# Patient Record
Sex: Male | Born: 1965 | Race: White | Hispanic: No | Marital: Married | Smoking: Never smoker
Health system: Southern US, Community
[De-identification: ages and names within clinical notes are randomized; demographics above are authoritative.]

## PROBLEM LIST (undated history)

## (undated) DIAGNOSIS — R7303 Prediabetes: Secondary | ICD-10-CM

## (undated) DIAGNOSIS — M109 Gout, unspecified: Secondary | ICD-10-CM

## (undated) HISTORY — DX: Prediabetes: R73.03

## (undated) HISTORY — DX: Gout, unspecified: M10.9

---

## 2010-07-08 ENCOUNTER — Inpatient Hospital Stay (HOSPITAL_COMMUNITY): Admission: EM | Admit: 2010-07-08 | Discharge: 2010-07-10 | Payer: Self-pay | Admitting: Emergency Medicine

## 2010-07-09 ENCOUNTER — Encounter (INDEPENDENT_AMBULATORY_CARE_PROVIDER_SITE_OTHER): Payer: Self-pay | Admitting: General Surgery

## 2011-02-02 LAB — COMPREHENSIVE METABOLIC PANEL
AST: 25 U/L (ref 0–37)
GFR calc non Af Amer: >60 mL/min (ref >60)
Glucose, Bld: 122 mg/dL — ABNORMAL HIGH (ref 70–99)
Potassium: 4.2 mEq/L (ref 3.5–5.1)
Sodium: 140 mEq/L (ref 135–145)
Total Bilirubin: 1 mg/dL (ref 0.3–1.2)
Total Protein: 7.3 g/dL (ref 6.0–8.3)

## 2011-02-02 LAB — DIFFERENTIAL
Basophils Absolute: 0 10*3/uL (ref 0.0–0.1)
Eosinophils Absolute: 0 10*3/uL (ref 0.0–0.7)
Eosinophils Relative: 0 % (ref 0–5)
Lymphocytes Relative: 11 % — ABNORMAL LOW (ref 12–46)

## 2011-02-02 LAB — URINALYSIS, ROUTINE W REFLEX MICROSCOPIC
Bilirubin Urine: NEGATIVE
Ketones, ur: NEGATIVE mg/dL
Nitrite: NEGATIVE
Specific Gravity, Urine: 1.027 (ref 1.005–1.030)
pH: 6.5 (ref 5.0–8.0)

## 2011-02-02 LAB — CBC
HCT: 42.9 % (ref 39.0–52.0)
Hemoglobin: 13.7 g/dL (ref 13.0–17.0)
MCHC: 31.9 g/dL (ref 30.0–36.0)
MCV: 93.3 fL (ref 78.0–100.0)
Platelets: 280 10*3/uL (ref 150–400)
RBC: 4.6 MIL/uL (ref 4.22–5.81)
RDW: 13.4 % (ref 11.5–15.5)

## 2012-03-31 ENCOUNTER — Ambulatory Visit
Admission: RE | Admit: 2012-03-31 | Discharge: 2012-03-31 | Disposition: A | Discharge status: Home / Self Care | Payer: No Typology Code available for payment source | Source: Ambulatory Visit | Attending: Physician Assistant | Admitting: Physician Assistant

## 2012-03-31 ENCOUNTER — Other Ambulatory Visit: Payer: Self-pay | Admitting: Physician Assistant

## 2012-03-31 DIAGNOSIS — M25562 Pain in left knee: Secondary | ICD-10-CM

## 2013-11-30 ENCOUNTER — Ambulatory Visit (INDEPENDENT_AMBULATORY_CARE_PROVIDER_SITE_OTHER): Payer: BC Managed Care – PPO | Admitting: Family Medicine

## 2013-11-30 ENCOUNTER — Encounter: Payer: Self-pay | Admitting: Family Medicine

## 2013-11-30 VITALS — BP 114/66 | HR 72 | Temp 97.7°F | Resp 18 | Ht 73.0 in | Wt 340.0 lb

## 2013-11-30 DIAGNOSIS — M109 Gout, unspecified: Secondary | ICD-10-CM | POA: Insufficient documentation

## 2013-11-30 MED ORDER — PREDNISONE 20 MG PO TABS: ORAL_TABLET | ORAL | Status: DC

## 2013-11-30 NOTE — Progress Notes (Signed)
   Subjective:    Patient ID: Walter [name], male    DOB: 06-24-1966, 48 y.o.   MRN: [id number]  HPI Patient has a history of gout. He reports 5-6 attacks per year. He was previously seen here 2 years ago for a similar attack in his left knee. He reports one and swelling in the left knee. This is been going for possibly 5 days. He denies any injury to the knee. He denies any joint laxity. He is to have an ultrasound of the left knee which revealed a Baker's cyst. During that gout exacerbation, history design, and experienced almost instantaneous relief of her. 24 hours. He is requesting similar treatment at this time. He is on no preventative medication for gout. He has not had a uric acid level checked nor any lab work. This is due to the fact he did not have insurance previously and declined laboratory testing due to cost. Past Medical History  Diagnosis Date  . Gout    No current outpatient prescriptions on file prior to visit.   No current facility-administered medications on file prior to visit.   No Known Allergies History   Social History  . Marital Status: Married    Spouse Name: N/A    Number of Children: N/A  . Years of Education: N/A   Occupational History  . Not on file.   Social History Main Topics  . Smoking status: Never Smoker   . Smokeless tobacco: Never Used  . Alcohol Use: No  . Drug Use: No  . Sexual Activity: Not on file   Other Topics Concern  . Not on file   Social History Narrative  . No narrative on file      Review of Systems  All other systems reviewed and are negative.       Objective:   Physical Exam  Vitals reviewed. Cardiovascular: Normal rate and regular rhythm.   Pulmonary/Chest: Effort normal and breath sounds normal.  Musculoskeletal:       Left knee: He exhibits decreased range of motion, swelling, effusion, bony tenderness and abnormal meniscus. He exhibits no erythema, no LCL laxity and normal patellar mobility. Tenderness  found. Medial joint line and lateral joint line tenderness noted. No MCL and no LCL tenderness noted.   patient is in such pain it is difficult to truly examine the left knee because everything hurts.        Assessment & Plan:  1. Gout flare I suspect this is a gout exacerbation. Treated with a prednisone taper pack. I also recommended that the patient return for a uric acid level as well some fasting lab work. If his uric acid level is elevated I recommend beginning allopurinol on a daily basis for preventative after he's been asymptomatic for 2-3 weeks.  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 - predniSONE (DELTASONE) 20 MG tablet; 3 tabs poqday 1-2, 2 tabs poqday 3-4, 1 tab poqday 5-6  Dispense: 12 tablet; Refill: 0

## 2014-03-22 ENCOUNTER — Ambulatory Visit (INDEPENDENT_AMBULATORY_CARE_PROVIDER_SITE_OTHER): Payer: BC Managed Care – PPO | Admitting: Family Medicine

## 2014-03-22 ENCOUNTER — Encounter: Payer: Self-pay | Admitting: Family Medicine

## 2014-03-22 VITALS — BP 118/74 | HR 80 | Temp 98.5°F | Resp 18 | Ht 75.0 in | Wt 335.0 lb

## 2014-03-22 DIAGNOSIS — IMO0002 Reserved for concepts with insufficient information to code with codable children: Secondary | ICD-10-CM

## 2014-03-22 DIAGNOSIS — S83206A Unspecified tear of unspecified meniscus, current injury, right knee, initial encounter: Secondary | ICD-10-CM

## 2014-03-22 MED ORDER — PREDNISONE 20 MG PO TABS: ORAL_TABLET | ORAL | Status: DC

## 2014-03-22 NOTE — Progress Notes (Signed)
   Subjective:    Patient ID: Walter [name], male    DOB: Jan 24, 1966, 48 y.o.   MRN: [id number]  HPI Patient reports several weeks of worsening bilateral knee pain right greater than left.  Both knees ache in the jointlines and under the patella, but the right knee has acute pain in the posterolateral aspect near the meniscus.  He has a significantly positive Apley grind test.  He has tried 1 month of Naproxyn with minimal benefit. Past Medical History  Diagnosis Date  . Gout    No current outpatient prescriptions on file prior to visit.   No current facility-administered medications on file prior to visit.   No Known Allergies History   Social History  . Marital Status: Married    Spouse Name: N/A    Number of Children: N/A  . Years of Education: N/A   Occupational History  . Not on file.   Social History Main Topics  . Smoking status: Never Smoker   . Smokeless tobacco: Never Used  . Alcohol Use: No  . Drug Use: No  . Sexual Activity: Not on file   Other Topics Concern  . Not on file   Social History Narrative  . No narrative on file      Review of Systems  All other systems reviewed and are negative.      Objective:   Physical Exam  Vitals reviewed. Cardiovascular: Normal rate and regular rhythm.   Pulmonary/Chest: Effort normal and breath sounds normal.  Musculoskeletal:       Right knee: He exhibits decreased range of motion, abnormal patellar mobility and abnormal meniscus. He exhibits no swelling, no effusion, no deformity, no laceration, no erythema, normal alignment and no LCL laxity. Tenderness found. Medial joint line and lateral joint line tenderness noted. No MCL and no LCL tenderness noted.          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  Suggested cortisone injection but he elects for 72 hours  of rest, ice, and a prednisone dose pack.  Follow up if no better.

## 2014-03-26 ENCOUNTER — Encounter: Payer: Self-pay | Admitting: Family Medicine

## 2014-03-26 ENCOUNTER — Ambulatory Visit (INDEPENDENT_AMBULATORY_CARE_PROVIDER_SITE_OTHER): Payer: BC Managed Care – PPO | Admitting: Family Medicine

## 2014-03-26 ENCOUNTER — Other Ambulatory Visit: Payer: Self-pay | Admitting: Family Medicine

## 2014-03-26 VITALS — BP 120/82 | HR 76 | Temp 98.0°F | Resp 18 | Wt 327.0 lb

## 2014-03-26 DIAGNOSIS — M109 Gout, unspecified: Secondary | ICD-10-CM

## 2014-03-26 LAB — COMPLETE METABOLIC PANEL WITH GFR
ALT: 19 U/L (ref 0–53)
AST: 12 U/L (ref 0–37)
Albumin: 4.1 g/dL (ref 3.5–5.2)
Alkaline Phosphatase: 75 U/L (ref 39–117)
BILIRUBIN TOTAL: 0.4 mg/dL (ref 0.2–1.2)
BUN: 14 mg/dL (ref 6–23)
CALCIUM: 9.6 mg/dL (ref 8.4–10.5)
CHLORIDE: 100 meq/L (ref 96–112)
CO2: 26 mEq/L (ref 19–32)
CREATININE: 1.06 mg/dL (ref 0.50–1.35)
GFR, EST NON AFRICAN AMERICAN: 82 mL/min
GFR, Est African American: >89 mL/min
GLUCOSE: 116 mg/dL — AB (ref 70–99)
Potassium: 4.8 mEq/L (ref 3.5–5.3)
Sodium: 138 mEq/L (ref 135–145)
TOTAL PROTEIN: 6.9 g/dL (ref 6.0–8.3)

## 2014-03-26 LAB — URIC ACID: Uric Acid, Serum: 9.3 mg/dL — ABNORMAL HIGH (ref 4.0–7.8)

## 2014-03-26 NOTE — Progress Notes (Signed)
   Subjective:    Patient ID: Walter [name], male    DOB: 11-Feb-1966, 48 y.o.   MRN: [id number]  HPI 03/22/14 Patient reports several weeks of worsening bilateral knee pain right greater than left.  Both knees ache in the jointlines and under the patella, but the right knee has acute pain in the posterolateral aspect near the meniscus.  He has a significantly positive Apley grind test.  He has tried 1 month of Naproxyn with minimal benefit.  At that time, my plan was: I suspect bilateral osteoarthritis made acutely worse by a right lateral meniscal tear.  Suggested cortisone injection but he elects for 72 hours of rest, ice, and a prednisone dose pack.  Follow up if no better. 03/26/14 Patient is here today for recheck. He states his knee pain is proximally 95% better. He is still taking the prednisone dose pack. However he is satisfied with his pain relief at this time. He is interested today in starting medication to help prevent gout attacks. He has frequent gout attacks throughout the year. He also has almost daily pain in his knee in between the attacks. No one has checked a uric acid level. Past Medical History  Diagnosis Date  . Gout    Current Outpatient Prescriptions on File Prior to Visit  Medication Sig Dispense Refill  . predniSONE (DELTASONE) 20 MG tablet 3 tabs poqday 1-2, 2 tabs poqday 3-4, 1 tab poqday 5-6  12 tablet  0   No current facility-administered medications on file prior to visit.   No Known Allergies History   Social History  . Marital Status: Married    Spouse Name: N/A    Number of Children: N/A  . Years of Education: N/A   Occupational History  . Not on file.   Social History Main Topics  . Smoking status: Never Smoker   . Smokeless tobacco: Never Used  . Alcohol Use: No  . Drug Use: No  . Sexual Activity: Not on file   Other Topics Concern  . Not on file   Social History Narrative  . No narrative on file      Review of Systems  All other  systems reviewed and are negative.      Objective:   Physical Exam  Vitals reviewed. Cardiovascular: Normal rate and regular rhythm.   Pulmonary/Chest: Effort normal and breath sounds normal.  Musculoskeletal:       Right knee: He exhibits normal range of motion, no swelling, no effusion, no deformity, no laceration, no erythema, normal alignment, no LCL laxity and normal meniscus. Tenderness found. No medial joint line, no lateral joint line, no MCL and no LCL tenderness noted.          Assessment & Plan:  Gout - Plan: COMPLETE METABOLIC PANEL WITH GFR, Uric acid  1. Gout Check uric acid level, and greater than 6 begin the patient on haloperidol. I would simultaneously use indomethacin 25 mg by mouth daily to prevent gout attack while the patient is starting allopurinol. After 6 weeks I would recheck a uric acid level. I also recommended a complete physical exam, prostate exam, and a colonoscopy as the patient is starting to turn 48. He will consider. - COMPLETE METABOLIC PANEL WITH GFR - Uric acid

## 2014-03-30 LAB — HEMOGLOBIN A1C
Hgb A1c MFr Bld: 6.2 % — ABNORMAL HIGH (ref <5.7)
Mean Plasma Glucose: 131 mg/dL — ABNORMAL HIGH (ref <117)

## 2014-03-31 ENCOUNTER — Telehealth: Payer: Self-pay | Admitting: Family Medicine

## 2014-03-31 DIAGNOSIS — M109 Gout, unspecified: Secondary | ICD-10-CM

## 2014-03-31 DIAGNOSIS — E790 Hyperuricemia without signs of inflammatory arthritis and tophaceous disease: Secondary | ICD-10-CM

## 2014-03-31 MED ORDER — ALLOPURINOL 100 MG PO TABS: 200.0000 mg | ORAL_TABLET | Freq: Every day | ORAL | Status: DC

## 2014-03-31 MED ORDER — INDOMETHACIN 25 MG PO CAPS: 25.0000 mg | ORAL_CAPSULE | Freq: Every day | ORAL | Status: DC

## 2014-03-31 NOTE — Telephone Encounter (Signed)
Message copied by Donne Anon on Wed Mar 31, 2014  9:49 AM ------      Message from: Lynnea Ferrier      Created: Mon Mar 29, 2014  7:17 AM       Add hga1c, uric acid is elevated, start allopurinol 200 mg poqday along with indomethicin 25 mg poqday and recheck uric acid level in 6 weeks. ------

## 2014-03-31 NOTE — Telephone Encounter (Signed)
Pt aware of all lab results.  Discussed low carb diet (to send pt counting carb sheet)  And try to lose some weight.  Also aware of high uric acid.  Two new Rx's to pharmacy.  Pt aware need to repeat lab in 6 weeks.  Order placed

## 2014-03-31 NOTE — Telephone Encounter (Signed)
Message copied by Donne Anon on Wed Mar 31, 2014  9:50 AM ------      Message from: Lynnea Ferrier      Created: Tue Mar 30, 2014  4:18 PM       Patient also has prediabetes and needs to decrease carbs (bread, rice, pasta, potatoes) and exercise to lose 10-20 lbs to prevent transition into full blown diabetes. ------

## 2014-04-01 ENCOUNTER — Encounter: Payer: Self-pay | Admitting: Family Medicine

## 2014-07-09 ENCOUNTER — Other Ambulatory Visit: Payer: Self-pay | Admitting: Family Medicine

## 2014-07-09 NOTE — Telephone Encounter (Signed)
Refill appropriate and filled per protocol. 

## 2015-09-28 ENCOUNTER — Ambulatory Visit (INDEPENDENT_AMBULATORY_CARE_PROVIDER_SITE_OTHER): Payer: BLUE CROSS/BLUE SHIELD

## 2015-09-28 ENCOUNTER — Encounter: Payer: Self-pay | Admitting: Podiatry

## 2015-09-28 ENCOUNTER — Ambulatory Visit (INDEPENDENT_AMBULATORY_CARE_PROVIDER_SITE_OTHER): Payer: BLUE CROSS/BLUE SHIELD | Admitting: Podiatry

## 2015-09-28 VITALS — BP 134/69 | HR 77 | Resp 16

## 2015-09-28 DIAGNOSIS — M1000 Idiopathic gout, unspecified site: Secondary | ICD-10-CM

## 2015-09-28 DIAGNOSIS — M722 Plantar fascial fibromatosis: Secondary | ICD-10-CM

## 2015-09-28 DIAGNOSIS — M779 Enthesopathy, unspecified: Secondary | ICD-10-CM | POA: Diagnosis not present

## 2015-09-28 MED ORDER — TRIAMCINOLONE ACETONIDE 10 MG/ML IJ SUSP
10.0000 mg | Freq: Once | INTRAMUSCULAR | Status: AC
  Administered 2015-09-28: 10 mg

## 2015-09-28 MED ORDER — METHYLPREDNISOLONE 4 MG PO TBPK: ORAL_TABLET | ORAL | Status: DC

## 2015-09-28 NOTE — Progress Notes (Signed)
   Subjective:    Patient ID: Walter [name], male    DOB: 04-13-1966, 49 y.o.   MRN: [id number]  HPI  Pt presents with left foot pain at mpj with redness and swelling lasting 1 month, has a h/o of gout  Review of Systems  All other systems reviewed and are negative.      Objective:   Physical Exam        Assessment & Plan:

## 2015-09-28 NOTE — Progress Notes (Signed)
Subjective:     Patient ID: Walter [name], male   DOB: 12/08/1965, 49 y.o.   MRN: [id number]  HPI patient presents stating the left big toe joint is really sore and swollen and it's been this way for about a month and I have had history and gout but mostly in my knees   Review of Systems  All other systems reviewed and are negative.      Objective:   Physical Exam  Constitutional: He is oriented to person, place, and time.  Cardiovascular: Intact distal pulses.   Musculoskeletal: Normal range of motion.  Neurological: He is oriented to person, place, and time.  Skin: Skin is warm.  Nursing note and vitals reviewed.  neurovascular status found to be intact muscle strength adequate range of motion within normal limits with patient found to have inflamed red first MPJ left that's painful when pressed and is localized in nature. Patient has good digital perfusion and is noted to be well oriented 3 with moderate depression of the arch     Assessment:      inflammatory capsulitis left first MPJ with possibility for gout or other unknown systemic or localized condition    Plan:      H&P condition reviewed and x-rays reviewed indicating cyst in the first metatarsal head. Today were to treat conservatively see response and decide if any more appropriate testing is necessary and I did inject the capsule 3 mg Kenalog 5 mg Xylocaine placed on Dosepak and sent for blood work. Reappoint to reevaluate again in 1 week

## 2015-10-05 ENCOUNTER — Ambulatory Visit: Payer: BLUE CROSS/BLUE SHIELD | Admitting: Podiatry

## 2015-10-07 ENCOUNTER — Ambulatory Visit: Payer: BLUE CROSS/BLUE SHIELD | Admitting: Podiatry

## 2017-01-01 ENCOUNTER — Encounter: Payer: Self-pay | Admitting: Family Medicine

## 2017-01-01 ENCOUNTER — Ambulatory Visit (INDEPENDENT_AMBULATORY_CARE_PROVIDER_SITE_OTHER): Payer: PRIVATE HEALTH INSURANCE | Admitting: Family Medicine

## 2017-01-01 VITALS — BP 130/90 | HR 86 | Temp 100.0°F | Resp 22 | Ht 75.0 in | Wt 344.0 lb

## 2017-01-01 DIAGNOSIS — R509 Fever, unspecified: Secondary | ICD-10-CM | POA: Diagnosis not present

## 2017-01-01 DIAGNOSIS — H6592 Unspecified nonsuppurative otitis media, left ear: Secondary | ICD-10-CM

## 2017-01-01 DIAGNOSIS — J111 Influenza due to unidentified influenza virus with other respiratory manifestations: Secondary | ICD-10-CM | POA: Diagnosis not present

## 2017-01-01 LAB — INFLUENZA A AND B AG, IMMUNOASSAY
INFLUENZA A ANTIGEN: NOT DETECTED
INFLUENZA B ANTIGEN: NOT DETECTED

## 2017-01-01 MED ORDER — AMOXICILLIN 875 MG PO TABS: 875.0000 mg | ORAL_TABLET | Freq: Two times a day (BID) | ORAL | 0 refills | Status: DC

## 2017-01-01 MED ORDER — OSELTAMIVIR PHOSPHATE 75 MG PO CAPS: 75.0000 mg | ORAL_CAPSULE | Freq: Two times a day (BID) | ORAL | 0 refills | Status: DC

## 2017-01-01 NOTE — Progress Notes (Signed)
   Subjective:    Patient ID: Walter [name], male    DOB: November 28, 1965, 51 y.o.   MRN: [id number]  HPI Symptoms began less than 48 hours ago. Symptoms consist of a high fever of 100-102 areas and he reports diffuse body aches, a nonproductive cough, severe sore throat, and left ear pain. On examination today, the patient is diaphoretic. He is nontoxic but he does appear sick. Left tympanic membrane is bulging and erythematous with a middle ear effusion. Otherwise remainder of his exam is unremarkable. However symptoms are consistent with influenza Past Medical History:  Diagnosis Date  . Gout    No past surgical history on file. No current outpatient prescriptions on file prior to visit.   No current facility-administered medications on file prior to visit.    No Known Allergies Social History   Social History  . Marital status: Married    Spouse name: N/A  . Number of children: N/A  . Years of education: N/A   Occupational History  . Not on file.   Social History Main Topics  . Smoking status: Never Smoker  . Smokeless tobacco: Never Used  . Alcohol use No  . Drug use: No  . Sexual activity: Not on file   Other Topics Concern  . Not on file   Social History Narrative  . No narrative on file      Review of Systems  All other systems reviewed and are negative.      Objective:   Physical Exam  HENT:  Right Ear: External ear normal.  Left Ear: External ear normal. Tympanic membrane is injected, erythematous and bulging.  Nose: Mucosal edema and rhinorrhea present.  Mouth/Throat: Oropharynx is clear and moist. No oropharyngeal exudate.  Neck: Neck supple.  Cardiovascular: Normal rate, regular rhythm and normal heart sounds.   Pulmonary/Chest: Effort normal and breath sounds normal. No respiratory distress. He has no wheezes. He has no rales.  Abdominal: Soft. Bowel sounds are normal.  Lymphadenopathy:    He has no cervical adenopathy.  Vitals  reviewed.         Assessment & Plan:  Influenza, left otitis media  Clinically the patient has the flu. Begin Tamiflu 75 mg by mouth twice a day for 5 days. Return to work when he is been afebrile for 48 hours. Push fluids and rest. I am very impressed by the severity of his otitis media on the left side. The patient also has a virus I'm concerned this could be a secondary bacterial infection. I will treat his otitis media with amoxicillin 875 mg by mouth twice a day for 10 days.

## 2017-01-04 ENCOUNTER — Ambulatory Visit (INDEPENDENT_AMBULATORY_CARE_PROVIDER_SITE_OTHER): Payer: PRIVATE HEALTH INSURANCE | Admitting: Family Medicine

## 2017-01-04 ENCOUNTER — Encounter: Payer: Self-pay | Admitting: Family Medicine

## 2017-01-04 VITALS — BP 130/90 | HR 100 | Temp 100.9°F | Resp 20 | Ht 75.0 in | Wt 335.0 lb

## 2017-01-04 DIAGNOSIS — J111 Influenza due to unidentified influenza virus with other respiratory manifestations: Secondary | ICD-10-CM

## 2017-01-04 MED ORDER — HYDROCODONE-HOMATROPINE 5-1.5 MG PO TABS: 1.0000 | ORAL_TABLET | Freq: Four times a day (QID) | ORAL | 0 refills | Status: DC | PRN

## 2017-01-04 NOTE — Progress Notes (Signed)
Subjective:    Patient ID: Walter [name], male    DOB: Apr 01, 1966, 51 y.o.   MRN: [id number]  HPI  01/01/17 Symptoms began less than 48 hours ago. Symptoms consist of a high fever of 100-102 areas and he reports diffuse body aches, a nonproductive cough, severe sore throat, and left ear pain. On examination today, the patient is diaphoretic. He is nontoxic but he does appear sick. Left tympanic membrane is bulging and erythematous with a middle ear effusion. Otherwise remainder of his exam is unremarkable. However symptoms are consistent with influenza.  At that time, my plan was: Clinically the patient has the flu. Begin Tamiflu 75 mg by mouth twice a day for 5 days. Return to work when he is been afebrile for 48 hours. Push fluids and rest. I am very impressed by the severity of his otitis media on the left side. The patient also has a virus I'm concerned this could be a secondary bacterial infection. I will treat his otitis media with amoxicillin 875 mg by mouth twice a day for 10 days.  01/04/17 Patient continues to run a fever. He continues to report diffuse body aches.  He reports headaches. His biggest complaint is a cough that will not stop the keeps him from sleeping. He also reports bilateral pleurisy due to frequency of his coughing. Unfortunately today his pulmonary exam is completely normal and his lungs are clear to auscultation bilaterally with no wheezes crackles or rales or evidence of pneumonia Past Medical History:  Diagnosis Date  . Gout    No past surgical history on file. Current Outpatient Prescriptions on File Prior to Visit  Medication Sig Dispense Refill  . amoxicillin (AMOXIL) 875 MG tablet Take 1 tablet (875 mg total) by mouth 2 (two) times daily. 20 tablet 0  . oseltamivir (TAMIFLU) 75 MG capsule Take 1 capsule (75 mg total) by mouth 2 (two) times daily. 10 capsule 0   No current facility-administered medications on file prior to visit.    No Known  Allergies Social History   Social History  . Marital status: Married    Spouse name: N/A  . Number of children: N/A  . Years of education: N/A   Occupational History  . Not on file.   Social History Main Topics  . Smoking status: Never Smoker  . Smokeless tobacco: Never Used  . Alcohol use No  . Drug use: No  . Sexual activity: Not on file   Other Topics Concern  . Not on file   Social History Narrative  . No narrative on file      Review of Systems  All other systems reviewed and are negative.      Objective:   Physical Exam  HENT:  Right Ear: External ear normal.  Left Ear: External ear normal. Tympanic membrane is injected, erythematous and bulging.  Nose: Mucosal edema and rhinorrhea present.  Mouth/Throat: Oropharynx is clear and moist. No oropharyngeal exudate.  Neck: Neck supple.  Cardiovascular: Normal rate, regular rhythm and normal heart sounds.   Pulmonary/Chest: Effort normal and breath sounds normal. No respiratory distress. He has no wheezes. He has no rales.  Abdominal: Soft. Bowel sounds are normal.  Lymphadenopathy:    He has no cervical adenopathy.  Vitals reviewed.         Assessment & Plan:  Influenza  The patient still appears to be suffering from the flu. I will give him Hycodan 1 tablet every 6 hours as needed for cough.  I recommended pushing fluids and rest. There is no physical sign of pneumonia on his exam today. The remainder of his exam is reassuring. If symptoms worsen over the weekend, the patient needs a chest x-ray to rule out pneumonia however his exam today is reassuring.

## 2017-01-07 ENCOUNTER — Encounter: Payer: Self-pay | Admitting: Family Medicine

## 2017-01-07 ENCOUNTER — Ambulatory Visit (INDEPENDENT_AMBULATORY_CARE_PROVIDER_SITE_OTHER): Payer: PRIVATE HEALTH INSURANCE | Admitting: Family Medicine

## 2017-01-07 VITALS — BP 136/70 | HR 100 | Temp 100.3°F | Resp 22 | Ht 75.0 in | Wt 332.0 lb

## 2017-01-07 DIAGNOSIS — M109 Gout, unspecified: Secondary | ICD-10-CM

## 2017-01-07 DIAGNOSIS — J111 Influenza due to unidentified influenza virus with other respiratory manifestations: Secondary | ICD-10-CM

## 2017-01-07 MED ORDER — PREDNISONE 20 MG PO TABS: ORAL_TABLET | ORAL | 0 refills | Status: DC

## 2017-01-07 MED ORDER — METHYLPREDNISOLONE ACETATE 40 MG/ML IJ SUSP
40.0000 mg | Freq: Once | INTRAMUSCULAR | Status: AC
  Administered 2017-01-07: 60 mg via INTRAMUSCULAR

## 2017-01-07 NOTE — Addendum Note (Signed)
Addended by: Legrand Rams B on: 01/07/2017 02:17 PM   Modules accepted: Orders

## 2017-01-07 NOTE — Progress Notes (Signed)
Subjective:    Patient ID: Walter [name], male    DOB: 03-01-1966, 51 y.o.   MRN: [id number]  HPI  01/01/17 Symptoms began less than 48 hours ago. Symptoms consist of a high fever of 100-102 areas and he reports diffuse body aches, a nonproductive cough, severe sore throat, and left ear pain. On examination today, the patient is diaphoretic. He is nontoxic but he does appear sick. Left tympanic membrane is bulging and erythematous with a middle ear effusion. Otherwise remainder of his exam is unremarkable. However symptoms are consistent with influenza.  At that time, my plan was: Clinically the patient has the flu. Begin Tamiflu 75 mg by mouth twice a day for 5 days. Return to work when he is been afebrile for 48 hours. Push fluids and rest. I am very impressed by the severity of his otitis media on the left side. The patient also has a virus I'm concerned this could be a secondary bacterial infection. I will treat his otitis media with amoxicillin 875 mg by mouth twice a day for 10 days.  01/04/17 Patient continues to run a fever. He continues to report diffuse body aches.  He reports headaches. His biggest complaint is a cough that will not stop the keeps him from sleeping. He also reports bilateral pleurisy due to frequency of his coughing. Unfortunately today his pulmonary exam is completely normal and his lungs are clear to auscultation bilaterally with no wheezes crackles or rales or evidence of pneumonia.  At that time, my plan was: The patient still appears to be suffering from the flu. I will give him Hycodan 1 tablet every 6 hours as needed for cough. I recommended pushing fluids and rest. There is no physical sign of pneumonia on his exam today. The remainder of his exam is reassuring. If symptoms worsen over the weekend, the patient needs a chest x-ray to rule out pneumonia however his exam today is reassuring.  01/07/17 Continues to report a fever although the fever is much better.  Patient states that he does feel better today. The cough has improved. He denies any chest pain or shortness of breath. He denies any hemoptysis. However he is suddenly developed the acute onset of pain in his left knee. He has a history of gout. His gout exacerbations usually occur in his knee. This feels similar. There is a moderate effusion in the left knee. Is extremely tender to palpation particularly over the lateral joint line. There is no erythema but there is warmth. He denies any falls or injuries. Due to pain, I am unable to assess for ligamentous instability Past Medical History:  Diagnosis Date  . Gout    No past surgical history on file. Current Outpatient Prescriptions on File Prior to Visit  Medication Sig Dispense Refill  . amoxicillin (AMOXIL) 875 MG tablet Take 1 tablet (875 mg total) by mouth 2 (two) times daily. 20 tablet 0  . Hydrocodone-Homatropine 5-1.5 MG TABS Take 1 tablet by mouth 4 (four) times daily as needed. 60 each 0   No current facility-administered medications on file prior to visit.    No Known Allergies Social History   Social History  . Marital status: Married    Spouse name: N/A  . Number of children: N/A  . Years of education: N/A   Occupational History  . Not on file.   Social History Main Topics  . Smoking status: Never Smoker  . Smokeless tobacco: Never Used  . Alcohol use No  .  Drug use: No  . Sexual activity: Not on file   Other Topics Concern  . Not on file   Social History Narrative  . No narrative on file      Review of Systems  All other systems reviewed and are negative.      Objective:   Physical Exam  HENT:  Right Ear: External ear normal.  Left Ear: External ear normal. Tympanic membrane is injected, erythematous and bulging.  Nose: Mucosal edema and rhinorrhea present.  Mouth/Throat: Oropharynx is clear and moist. No oropharyngeal exudate.  Neck: Neck supple.  Cardiovascular: Normal rate, regular rhythm and  normal heart sounds.   Pulmonary/Chest: Effort normal and breath sounds normal. No respiratory distress. He has no wheezes. He has no rales.  Abdominal: Soft. Bowel sounds are normal.  Musculoskeletal:       Right knee: He exhibits decreased range of motion, swelling and effusion. Tenderness found. Lateral joint line tenderness noted.  Lymphadenopathy:    He has no cervical adenopathy.  Vitals reviewed.         Assessment & Plan:  Exacerbation of gout - Plan: predniSONE (DELTASONE) 20 MG tablet, DISCONTINUED: predniSONE (DELTASONE) 20 MG tablet  Influenza - Plan: DG Chest 2 View  I believe the patient is slowly recovering from the flu although he is still running a fever. I would like to get a chest x-ray given the persistence of the fever. However clinically the patient appears better. I suspect a gout exacerbation. I recommended that we aspirate the joint to rule out septic arthritis but the patient declines. He would like to treat this empirically for gout exacerbation. His choices include colchicine and prednisone. He is always to experience the best benefit from prednisone and therefore he requests this evening he is running a fever. He is willing to except the risk. Patient received 60 mg of Depo-Medrol 1 and will begin a prednisone taper pack starting tomorrow.

## 2017-01-09 ENCOUNTER — Ambulatory Visit
Admission: RE | Admit: 2017-01-09 | Discharge: 2017-01-09 | Disposition: A | Discharge status: Home / Self Care | Payer: Self-pay | Source: Ambulatory Visit | Attending: Family Medicine | Admitting: Family Medicine

## 2017-01-09 DIAGNOSIS — J111 Influenza due to unidentified influenza virus with other respiratory manifestations: Secondary | ICD-10-CM

## 2017-01-10 ENCOUNTER — Telehealth: Payer: Self-pay | Admitting: Family Medicine

## 2017-01-10 MED ORDER — AZITHROMYCIN 250 MG PO TABS: ORAL_TABLET | ORAL | 0 refills | Status: DC

## 2017-01-10 NOTE — Telephone Encounter (Signed)
Pt made aware of CXR results.  Still with fever.  Told to stop Amoxicillin and start Z-pak.  Rx to pharmacy.

## 2017-01-10 NOTE — Telephone Encounter (Signed)
-----   Message from Donita Brooks, MD sent at 01/10/2017  7:22 AM EST ----- CXR shows: Airway thickening and interstitial accentuation which may reflect viral/atypical infection.  If still running fever, I would add zpack.

## 2017-01-11 ENCOUNTER — Telehealth: Payer: Self-pay | Admitting: Family Medicine

## 2017-01-11 DIAGNOSIS — M109 Gout, unspecified: Secondary | ICD-10-CM

## 2017-01-11 MED ORDER — PREDNISONE 20 MG PO TABS: ORAL_TABLET | ORAL | 0 refills | Status: DC

## 2017-01-11 NOTE — Telephone Encounter (Signed)
Pt states his gout has not got better and would like to see if he can get another round of prednisone sent to [facility] pharmacy.

## 2017-01-11 NOTE — Telephone Encounter (Signed)
Ok with one additional round.  NTBS for joint aspiration if no better

## 2017-01-11 NOTE — Telephone Encounter (Signed)
Patient aware of providers recommendations via vm.  And Medication called/sent to requested pharmacy

## 2017-01-21 ENCOUNTER — Ambulatory Visit (INDEPENDENT_AMBULATORY_CARE_PROVIDER_SITE_OTHER): Payer: PRIVATE HEALTH INSURANCE | Admitting: Family Medicine

## 2017-01-21 ENCOUNTER — Encounter: Payer: Self-pay | Admitting: Family Medicine

## 2017-01-21 VITALS — BP 118/78 | HR 86 | Temp 98.5°F | Resp 20 | Ht 75.0 in | Wt 328.0 lb

## 2017-01-21 DIAGNOSIS — M109 Gout, unspecified: Secondary | ICD-10-CM

## 2017-01-21 DIAGNOSIS — M25562 Pain in left knee: Secondary | ICD-10-CM

## 2017-01-21 NOTE — Progress Notes (Signed)
Subjective:    Patient ID: Walter [name], male    DOB: [birth year], 51 y.o.   MRN: [id number]  HPI  01/01/17 Symptoms began less than 48 hours ago. Symptoms consist of a high fever of 100-102 areas and he reports diffuse body aches, a nonproductive cough, severe sore throat, and left ear pain. On examination today, the patient is diaphoretic. He is nontoxic but he does appear sick. Left tympanic membrane is bulging and erythematous with a middle ear effusion. Otherwise remainder of his exam is unremarkable. However symptoms are consistent with influenza.  At that time, my plan was: Clinically the patient has the flu. Begin Tamiflu 75 mg by mouth twice a day for 5 days. Return to work when he is been afebrile for 48 hours. Push fluids and rest. I am very impressed by the severity of his otitis media on the left side. The patient also has a virus I'm concerned this could be a secondary bacterial infection. I will treat his otitis media with amoxicillin 875 mg by mouth twice a day for 10 days.  01/04/17 Patient continues to run a fever. He continues to report diffuse body aches.  He reports headaches. His biggest complaint is a cough that will not stop the keeps him from sleeping. He also reports bilateral pleurisy due to frequency of his coughing. Unfortunately today his pulmonary exam is completely normal and his lungs are clear to auscultation bilaterally with no wheezes crackles or rales or evidence of pneumonia.  At that time, my plan was: The patient still appears to be suffering from the flu. I will give him Hycodan 1 tablet every 6 hours as needed for cough. I recommended pushing fluids and rest. There is no physical sign of pneumonia on his exam today. The remainder of his exam is reassuring. If symptoms worsen over the weekend, the patient needs a chest x-ray to rule out pneumonia however his exam today is reassuring.  01/07/17 Continues to report a fever although the fever is much better.  Patient states that he does feel better today. The cough has improved. He denies any chest pain or shortness of breath. He denies any hemoptysis. However he is suddenly developed the acute onset of pain in his left knee. He has a history of gout. His gout exacerbations usually occur in his knee. This feels similar. There is a moderate effusion in the left knee. Is extremely tender to palpation particularly over the lateral joint line. There is no erythema but there is warmth. He denies any falls or injuries. Due to pain, I am unable to assess for ligamentous instability.  At that time, my plan was: I believe the patient is slowly recovering from the flu although he is still running a fever. I would like to get a chest x-ray given the persistence of the fever. However clinically the patient appears better. I suspect a gout exacerbation. I recommended that we aspirate the joint to rule out septic arthritis but the patient declines. He would like to treat this empirically for gout exacerbation. His choices include colchicine and prednisone. He is always to experience the best benefit from prednisone and therefore he requests this evening he is running a fever. He is willing to except the risk. Patient received 60 mg of Depo-Medrol 1 and will begin a prednisone taper pack starting tomorrow.  01/21/17 Patient states that his left knee is approximately 85% better. He still has pain with flexion beyond 90 located above the kneecap. There is  still trace edema around the knee joint. There is no laxity to varus or valgus stress. He has a negative anterior posterior drawer sign. He has a negative Apley grind. He is wanting to return to work Past Medical History:  Diagnosis Date  . Gout    No past surgical history on file. Current Outpatient Prescriptions on File Prior to Visit  Medication Sig Dispense Refill  . amoxicillin (AMOXIL) 875 MG tablet Take 1 tablet (875 mg total) by mouth 2 (two) times daily. 20 tablet 0   . azithromycin (ZITHROMAX) 250 MG tablet Take two tablets by mouth on day 1, then one tablet by mouth on days 2-5. 6 tablet 0  . Hydrocodone-Homatropine 5-1.5 MG TABS Take 1 tablet by mouth 4 (four) times daily as needed. 60 each 0  . predniSONE (DELTASONE) 20 MG tablet 3 tabs poqday 1-2, 2 tabs poqday 3-4, 1 tab poqday 5-6 12 tablet 0   No current facility-administered medications on file prior to visit.    No Known Allergies Social History   Social History  . Marital status: Married    Spouse name: N/A  . Number of children: N/A  . Years of education: N/A   Occupational History  . Not on file.   Social History Main Topics  . Smoking status: Never Smoker  . Smokeless tobacco: Never Used  . Alcohol use No  . Drug use: No  . Sexual activity: Not on file   Other Topics Concern  . Not on file   Social History Narrative  . No narrative on file      Review of Systems  All other systems reviewed and are negative.      Objective:   Physical Exam  HENT:  Right Ear: External ear normal.  Left Ear: External ear normal.  Mouth/Throat: Oropharynx is clear and moist. No oropharyngeal exudate.  Neck: Neck supple.  Cardiovascular: Normal rate, regular rhythm and normal heart sounds.   Pulmonary/Chest: Effort normal and breath sounds normal. No respiratory distress. He has no wheezes. He has no rales.  Abdominal: Soft. Bowel sounds are normal.  Musculoskeletal:       Left knee: He exhibits decreased range of motion. Tenderness found. Medial joint line and lateral joint line tenderness noted.  Lymphadenopathy:    He has no cervical adenopathy.  Vitals reviewed.         Assessment & Plan:  Acute pain of left knee - Plan: DG Knee Complete 4 Views Left  Exacerbation of gout  I believe the patient had a gout exacerbation that finally has improved after 2 rounds of prednisone. I will send the patient for an x-ray of his right knee to rule out other possible causes.  Patient can return to work. If the knee starts to bother him again, I would recommend a cortisone injection. Also recommended starting the patient back on allopurinol as a preventative once his knee is 100% better. I will treat him simultaneously with colchicine until uric acid levels have normalized after 4-6 weeks

## 2017-03-15 ENCOUNTER — Ambulatory Visit: Payer: PRIVATE HEALTH INSURANCE | Admitting: Family Medicine

## 2017-07-31 ENCOUNTER — Encounter: Payer: Self-pay | Admitting: Family Medicine

## 2018-01-09 IMAGING — CR DG CHEST 2V
2 series · 2 of 2 positions shown · non-contrast
Comparison: None.

CLINICAL DATA: Influenza. Fever. Cough and shortness of breath for
1 week.

EXAM:
CHEST  2 VIEW

[w chest pa]
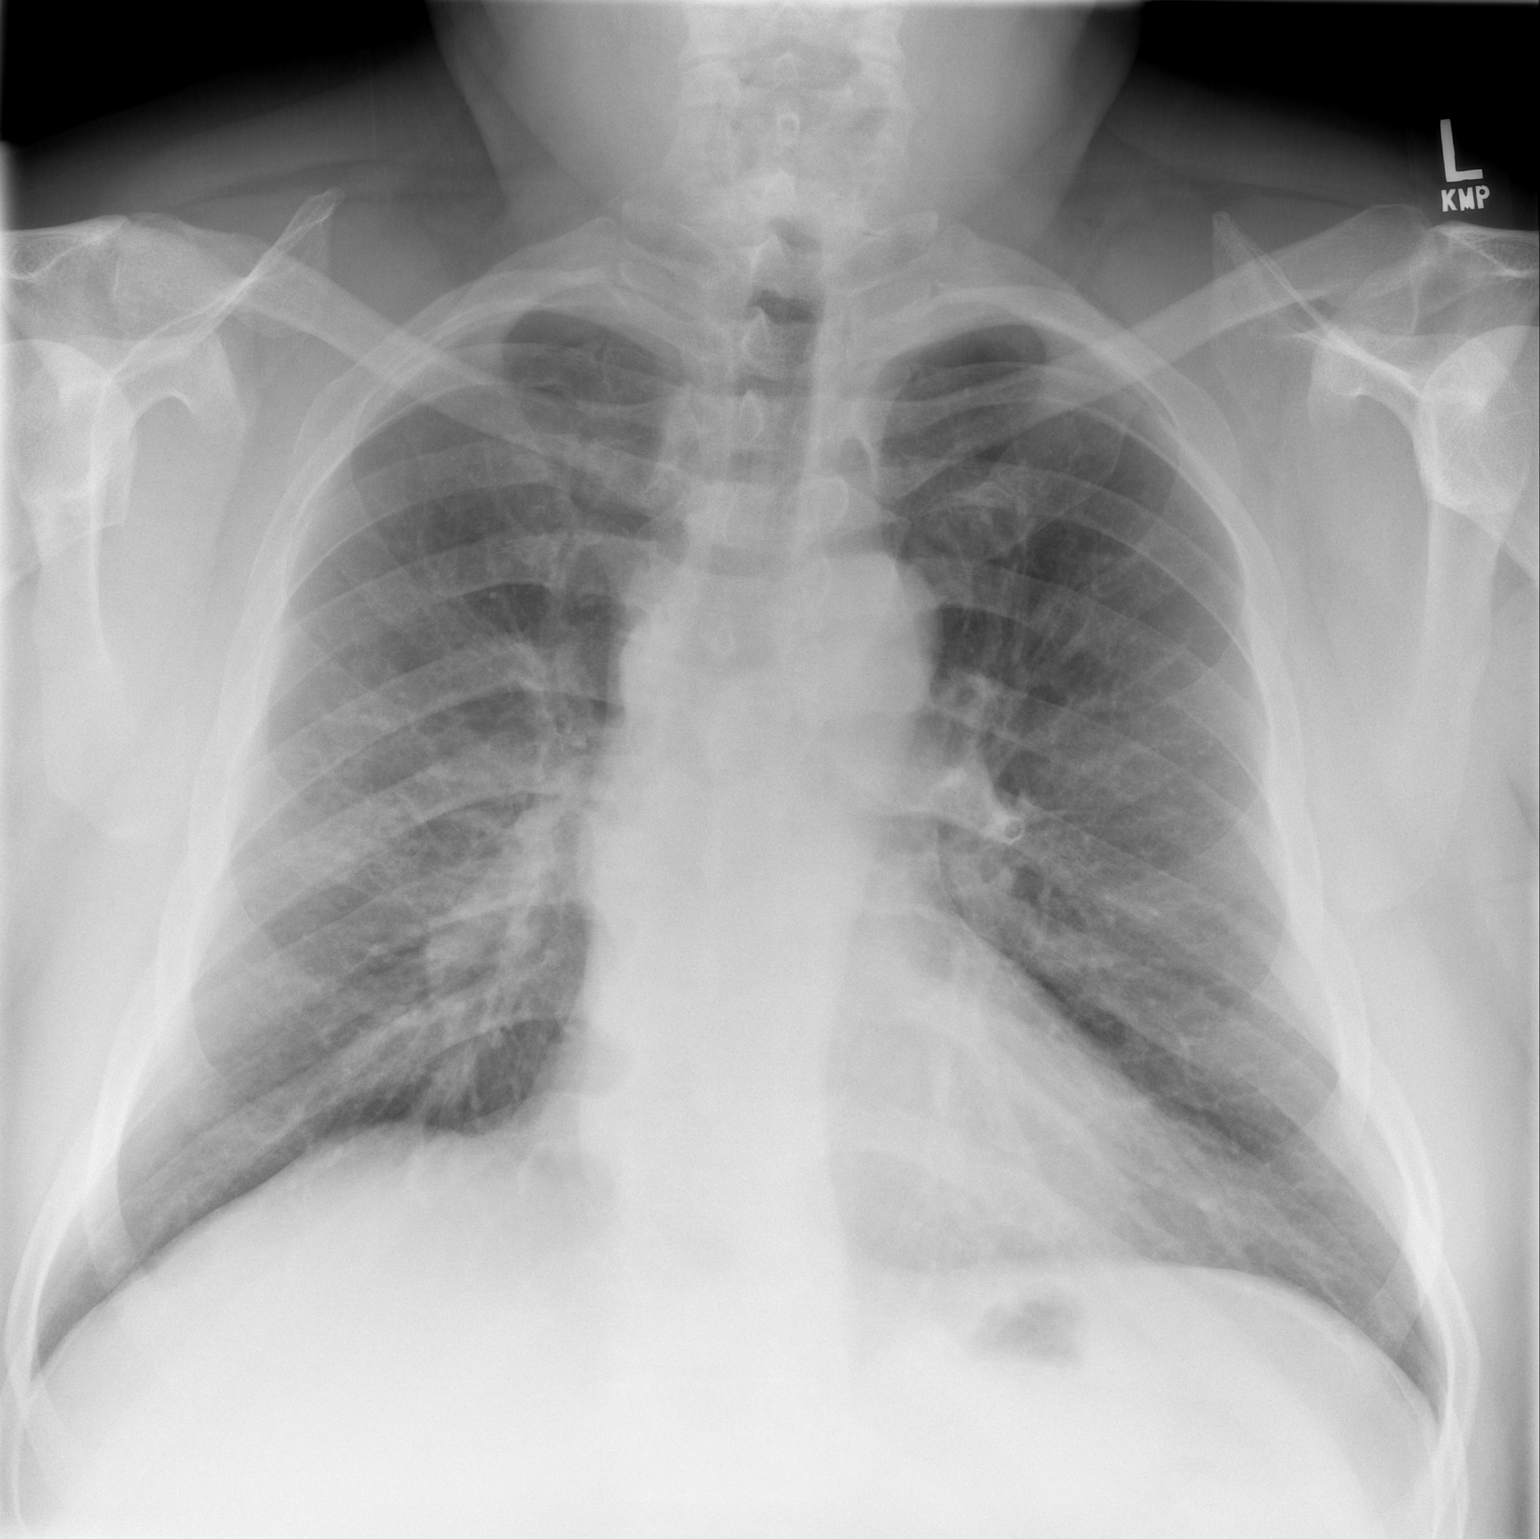

[w chest lat]
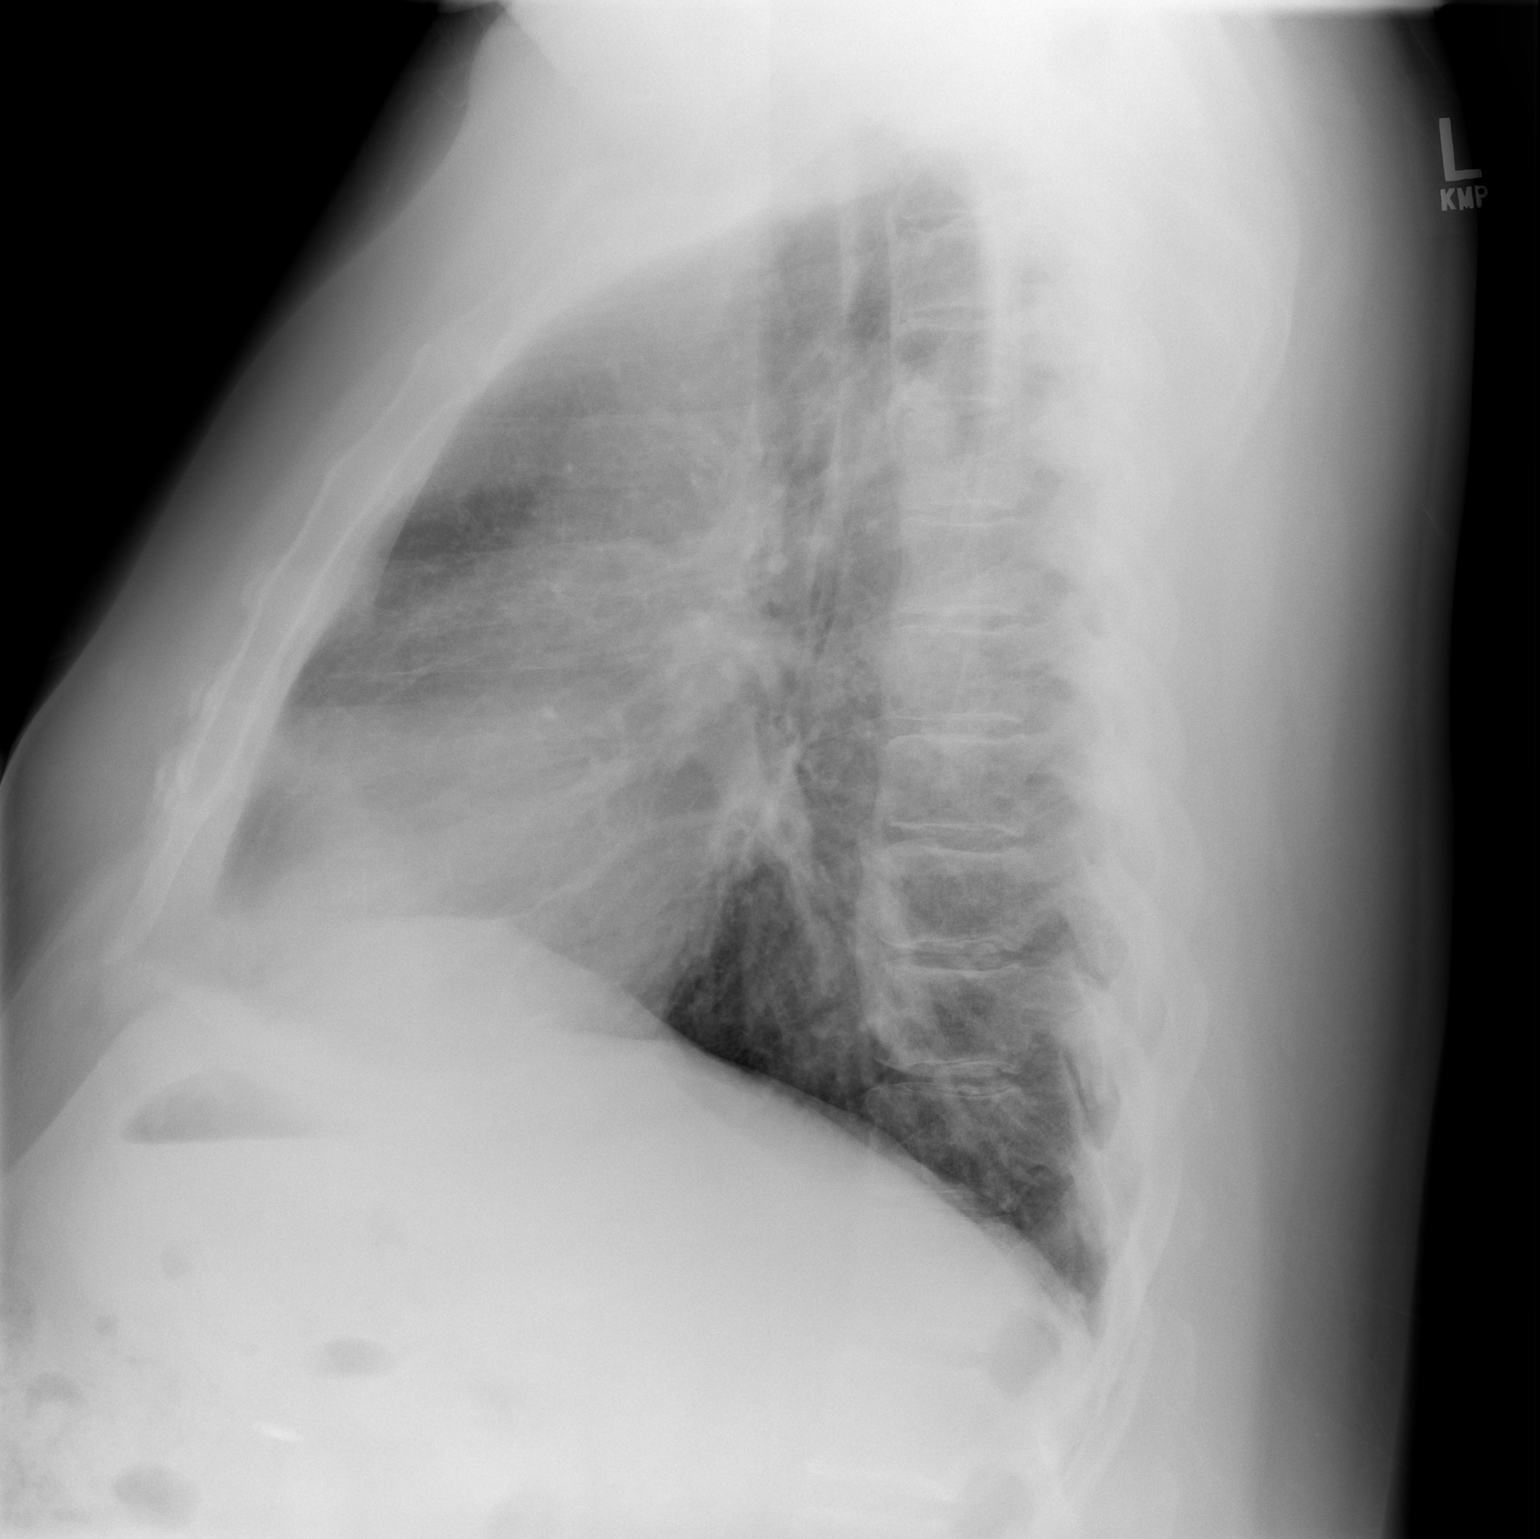

[2 of 2 positions shown; findings below may reference images not displayed]

FINDINGS: The cardiomediastinal silhouette is within normal limits. There is
peribronchial thickening with mild diffuse interstitial
accentuation. No confluent airspace opacity, pleural effusion, or
pneumothorax is identified. No acute osseous abnormality is seen.
IMPRESSION: Airway thickening and interstitial accentuation which may reflect
viral/atypical infection.

## 2018-01-20 ENCOUNTER — Ambulatory Visit: Payer: 59 | Admitting: Family Medicine

## 2018-01-20 ENCOUNTER — Encounter: Payer: Self-pay | Admitting: Family Medicine

## 2018-01-20 VITALS — BP 136/90 | HR 97 | Temp 99.2°F | Resp 20 | Ht 75.0 in | Wt 354.0 lb

## 2018-01-20 DIAGNOSIS — J019 Acute sinusitis, unspecified: Secondary | ICD-10-CM | POA: Diagnosis not present

## 2018-01-20 DIAGNOSIS — R52 Pain, unspecified: Secondary | ICD-10-CM | POA: Diagnosis not present

## 2018-01-20 DIAGNOSIS — R509 Fever, unspecified: Secondary | ICD-10-CM

## 2018-01-20 LAB — INFLUENZA A AND B AG, IMMUNOASSAY
INFLUENZA A ANTIGEN: NOT DETECTED
INFLUENZA B ANTIGEN: NOT DETECTED

## 2018-01-20 MED ORDER — AMOXICILLIN 875 MG PO TABS: 875.0000 mg | ORAL_TABLET | Freq: Two times a day (BID) | ORAL | 0 refills | Status: DC

## 2018-01-20 MED ORDER — HYDROCODONE-HOMATROPINE 5-1.5 MG PO TABS: 1.0000 | ORAL_TABLET | Freq: Four times a day (QID) | ORAL | 0 refills | Status: DC | PRN

## 2018-01-20 NOTE — Progress Notes (Signed)
   Subjective:    Patient ID: Walter [name], male    DOB: 02-25-1966, 52 y.o.   MRN: [id number]  HPI  Symptoms began 1 week ago.  Symptoms include pressure and pain in both maxillary sinuses and both frontal sinuses.  Symptoms include postnasal drip, sore scratchy throat, rhinorrhea, nonproductive cough.  He reports subjective fever.  He denies any body aches.  Flu test is negative.  He denies any shortness of breath or chest pain. Past Medical History:  Diagnosis Date  . Gout    No current outpatient medications on file prior to visit.   No current facility-administered medications on file prior to visit.    No Known Allergies Social History   Socioeconomic History  . Marital status: Married    Spouse name: Not on file  . Number of children: Not on file  . Years of education: Not on file  . Highest education level: Not on file  Social Needs  . Financial resource strain: Not on file  . Food insecurity - worry: Not on file  . Food insecurity - inability: Not on file  . Transportation needs - medical: Not on file  . Transportation needs - non-medical: Not on file  Occupational History  . Not on file  Tobacco Use  . Smoking status: Never Smoker  . Smokeless tobacco: Never Used  Substance and Sexual Activity  . Alcohol use: No  . Drug use: No  . Sexual activity: Not on file  Other Topics Concern  . Not on file  Social History Narrative  . Not on file     Review of Systems  All other systems reviewed and are negative.      Objective:   Physical Exam  Constitutional: He appears well-developed and well-nourished.  HENT:  Left Ear: Tympanic membrane is injected.  Nose: Mucosal edema and rhinorrhea present. Right sinus exhibits maxillary sinus tenderness and frontal sinus tenderness. Left sinus exhibits maxillary sinus tenderness and frontal sinus tenderness.  Cardiovascular: Normal rate, regular rhythm and normal heart sounds.  Pulmonary/Chest: Effort normal and  breath sounds normal. No respiratory distress. He has no wheezes. He has no rales.  Vitals reviewed.         Assessment & Plan:  Fever, unspecified fever cause - Plan: Influenza A and B Ag, Immunoassay  Body aches - Plan: Influenza A and B Ag, Immunoassay  Acute rhinosinusitis  Patient appears to have a sinus infection after a recent upper respiratory infection.  I recommended Sudafed and Afrin for nasal congestion and tincture of time for 2 days.  He can use Hycodan 1 tablet every 6 hours as needed for cough.  If symptoms are no better in 48 hours or are worsening, he can start amoxicillin 875 mg p.o. twice daily for 10 days

## 2018-01-22 ENCOUNTER — Encounter: Payer: Self-pay | Admitting: Physician Assistant

## 2018-01-22 ENCOUNTER — Encounter: Payer: Self-pay | Admitting: Family Medicine

## 2018-01-22 ENCOUNTER — Ambulatory Visit (INDEPENDENT_AMBULATORY_CARE_PROVIDER_SITE_OTHER): Payer: 59 | Admitting: Physician Assistant

## 2018-01-22 VITALS — BP 130/86 | HR 110 | Temp 99.6°F | Resp 18 | Wt 343.0 lb

## 2018-01-22 DIAGNOSIS — B9689 Other specified bacterial agents as the cause of diseases classified elsewhere: Secondary | ICD-10-CM

## 2018-01-22 DIAGNOSIS — J988 Other specified respiratory disorders: Secondary | ICD-10-CM

## 2018-01-22 MED ORDER — LEVOFLOXACIN 750 MG PO TABS: 750.0000 mg | ORAL_TABLET | Freq: Every day | ORAL | 0 refills | Status: DC

## 2018-01-22 MED ORDER — HYDROCODONE-HOMATROPINE 5-1.5 MG/5ML PO SYRP
5.0000 mL | ORAL_SOLUTION | Freq: Four times a day (QID) | ORAL | 0 refills | Status: DC | PRN
Start: 2018-01-22 — End: 2018-10-07

## 2018-01-22 NOTE — Progress Notes (Signed)
Patient ID: Walter [name] MRN: [id number], DOB: 12/13/1965, 52 y.o. Date of Encounter: @DATE @  Chief Complaint:  Chief Complaint  Patient presents with  . Cough  . rib pain from coughing  . Fever    HPI: 52 y.o. year old male  presents with above.   I reviewed his note from his visit with Dr. 54 here 01/20/18.  That visit was just this past Monday and it is now Wednesday.   Today patient reports that he get did go ahead and start taking the amoxicillin that evening so he took 1 dose on Monday night, took it twice daily yesterday and took his morning dose for today.   However, states that he "is worse ".  Says that his fever went up to 102 last night.  Says that the fever has been more persistent and higher.  Also is having worsening chest congestion and cough.   Past Medical History:  Diagnosis Date  . Gout      Home Meds: Outpatient Medications Prior to Visit  Medication Sig Dispense Refill  . amoxicillin (AMOXIL) 875 MG tablet Take 1 tablet (875 mg total) by mouth 2 (two) times daily. 20 tablet 0  . HYDROcodone-Homatropine 5-1.5 MG TABS Take 1 tablet by mouth 4 (four) times daily as needed. 60 each 0   No facility-administered medications prior to visit.     Allergies: No Known Allergies  Social History   Socioeconomic History  . Marital status: Married    Spouse name: Not on file  . Number of children: Not on file  . Years of education: Not on file  . Highest education level: Not on file  Social Needs  . Financial resource strain: Not on file  . Food insecurity - worry: Not on file  . Food insecurity - inability: Not on file  . Transportation needs - medical: Not on file  . Transportation needs - non-medical: Not on file  Occupational History  . Not on file  Tobacco Use  . Smoking status: Never Smoker  . Smokeless tobacco: Never Used  Substance and Sexual Activity  . Alcohol use: No  . Drug use: No  . Sexual activity: Not on file  Other Topics  Concern  . Not on file  Social History Narrative  . Not on file    History reviewed. No pertinent family history.   Review of Systems:  See HPI for pertinent ROS. All other ROS negative.    Physical Exam: Blood pressure 130/86, pulse (!) 110, temperature 99.6 F (37.6 C), temperature source Oral, resp. rate 18, weight (!) 155.6 kg (343 lb), SpO2 98 %., Body mass index is 42.87 kg/m. General: Obese WM. Appears in no acute distress. Head: Normocephalic, atraumatic, eyes without discharge, sclera non-icteric, nares are without discharge. Bilateral auditory canals clear.  Bilateral TMs are slightly dull and retracted.   Oral cavity moist, posterior pharynx without exudate, erythema, peritonsillar abscess.  There is no tenderness with percussion to frontal or maxillary sinuses today.  He states that this is better.  Neck: Supple. No thyromegaly. No lymphadenopathy. Lungs: Clear bilaterally to auscultation without wheezes, rales, or rhonchi. Breathing is unlabored.  Hear absolutely no wheezing and lungs sound clear. Heart: RRR with S1 S2. No murmurs, rubs, or gallops. Musculoskeletal:  Strength and tone normal for age. Extremities/Skin: Warm and dry.  Neuro: Alert and oriented X 3. Moves all extremities spontaneously. Gait is normal. CNII-XII grossly in tact. Psych:  Responds to questions appropriately with a  normal affect.     ASSESSMENT AND PLAN:  52 y.o. year old male with  1. Bacterial respiratory infection He can stop the amoxicillin and start Levaquin and take this as directed. Follow-up if symptoms do not resolve after completion of this. I was going to get some Hycodan but he states that Dr. Tanya Nones already gave him some pills for cough and he will continue to use these if needed. Note given to cover him being out of work all of this week.  Monday - Friday. - levofloxacin (LEVAQUIN) 750 MG tablet; Take 1 tablet (750 mg total) by mouth daily.  Dispense: 7 tablet; Refill:  0   Signed, [address] [place], [place], [facility] 01/22/2018 3:42 PM

## 2018-01-23 ENCOUNTER — Ambulatory Visit: Payer: 59 | Admitting: Family Medicine

## 2018-10-07 ENCOUNTER — Ambulatory Visit: Payer: 59 | Admitting: Family Medicine

## 2018-10-07 ENCOUNTER — Encounter: Payer: Self-pay | Admitting: Family Medicine

## 2018-10-07 VITALS — BP 142/90 | HR 90 | Temp 99.0°F | Resp 22 | Ht 75.0 in

## 2018-10-07 DIAGNOSIS — S8991XA Unspecified injury of right lower leg, initial encounter: Secondary | ICD-10-CM | POA: Diagnosis not present

## 2018-10-07 MED ORDER — HYDROCODONE-ACETAMINOPHEN 5-325 MG PO TABS: 1.0000 | ORAL_TABLET | Freq: Four times a day (QID) | ORAL | 0 refills | Status: DC | PRN

## 2018-10-07 NOTE — Progress Notes (Signed)
Subjective:    Patient ID: Walter [name], male    DOB: 04-03-1966, 52 y.o.   MRN: [id number]  HPI  Patient called Sunday with acute onset of pain in his right knee.  He has a history of gout and therefore he was started on prednisone for possible gout exacerbation.  The pain in his knee is worse.  He states that this does not feel like gout.  He states the pain began suddenly when he fell trying to get out of the tub on Thursday.  He twisted his knee as he fell.  Ever since he has had sharp severe pain in the posterior lateral aspect of the right knee.  He is unable to fully extend the knee due to sharp stabbing pain in the back of his right knee.  He denies any laxity to varus or valgus stress.  However he has severe pain with full extension of the knee and Apley grind causes him to scream raising the concern for a meniscal tear.  There is no redness.  There is no warmth.  There is no evidence of septic arthritis.  Patient is walking with crutches.  He is unable to bear weight Past Medical History:  Diagnosis Date  . Gout    No past surgical history on file. Current Outpatient Medications on File Prior to Visit  Medication Sig Dispense Refill  . predniSONE (DELTASONE) 10 MG tablet      No current facility-administered medications on file prior to visit.    No Known Allergies Social History   Socioeconomic History  . Marital status: Married    Spouse name: Not on file  . Number of children: Not on file  . Years of education: Not on file  . Highest education level: Not on file  Occupational History  . Not on file  Social Needs  . Financial resource strain: Not on file  . Food insecurity:    Worry: Not on file    Inability: Not on file  . Transportation needs:    Medical: Not on file    Non-medical: Not on file  Tobacco Use  . Smoking status: Never Smoker  . Smokeless tobacco: Never Used  Substance and Sexual Activity  . Alcohol use: No  . Drug use: No  . Sexual activity:  Not on file  Lifestyle  . Physical activity:    Days per week: Not on file    Minutes per session: Not on file  . Stress: Not on file  Relationships  . Social connections:    Talks on phone: Not on file    Gets together: Not on file    Attends religious service: Not on file    Active member of club or organization: Not on file    Attends meetings of clubs or organizations: Not on file    Relationship status: Not on file  . Intimate partner violence:    Fear of current or ex partner: Not on file    Emotionally abused: Not on file    Physically abused: Not on file    Forced sexual activity: Not on file  Other Topics Concern  . Not on file  Social History Narrative  . Not on file     Review of Systems  All other systems reviewed and are negative.      Objective:   Physical Exam  Cardiovascular: Normal rate, regular rhythm and normal heart sounds.  Pulmonary/Chest: Effort normal and breath sounds normal.  Musculoskeletal:  Right knee: He exhibits decreased range of motion, swelling and abnormal meniscus. He exhibits no erythema. Tenderness found. Lateral joint line tenderness noted. No MCL and no LCL tenderness noted.  Vitals reviewed.         Assessment & Plan:  Injury of posterolateral corner of right knee, initial encounter - Plan: HYDROcodone-acetaminophen (NORCO) 5-325 MG tablet, DG Knee Complete 4 Views Right, Ambulatory referral to Orthopedic Surgery  I suspect a meniscal tear.  Discontinue prednisone taper pack.  We discussed options and the patient elects to receive a cortisone injection.  Using sterile technique, the right knee was injected with a mixture of 2 cc lidocaine, 2 cc of Marcaine, and 2 cc of 40 mg/mL Kenalog.  The patient tolerated the procedure well without complication.  He does appear to have a small Baker's cyst behind the right knee which could be due to a meniscal tear.  Therefore will consult orthopedic surgery given the severity of the  pain he is experiencing.  I will also give the patient hydrocodone 5/325 1 p.o. every 6 hours as needed pain in order an x-ray of the knee to evaluate further although I doubt any occult fracture.

## 2018-10-08 ENCOUNTER — Telehealth: Payer: Self-pay | Admitting: Family Medicine

## 2018-10-08 NOTE — Telephone Encounter (Signed)
Pt called and states that he is in horrendous pain and the pain pills Dr. Tanya Nones gave him yesterday is not helping at all. He states that he has even doubled up on them and it does not touch the pain in his knee. He wanted to know if we could send him in something else that was stronger? (referral has been made for ortho - pending apt)

## 2018-10-08 NOTE — Telephone Encounter (Signed)
He needs to go to ER or walk in orthopedics today if that severe

## 2018-10-08 NOTE — Telephone Encounter (Signed)
Pt aware of recommendation and states that he has an apt Friday with [place] ortho and he will just deal with the pain until then.

## 2018-10-09 ENCOUNTER — Encounter: Payer: Self-pay | Admitting: Family Medicine

## 2018-10-10 ENCOUNTER — Encounter (INDEPENDENT_AMBULATORY_CARE_PROVIDER_SITE_OTHER): Payer: Self-pay | Admitting: Orthopedic Surgery

## 2018-10-10 ENCOUNTER — Ambulatory Visit (INDEPENDENT_AMBULATORY_CARE_PROVIDER_SITE_OTHER): Payer: 59 | Admitting: Orthopedic Surgery

## 2018-10-10 ENCOUNTER — Ambulatory Visit (INDEPENDENT_AMBULATORY_CARE_PROVIDER_SITE_OTHER): Payer: Self-pay

## 2018-10-10 DIAGNOSIS — M25561 Pain in right knee: Secondary | ICD-10-CM | POA: Diagnosis not present

## 2018-10-10 DIAGNOSIS — M25461 Effusion, right knee: Secondary | ICD-10-CM | POA: Diagnosis not present

## 2018-10-10 MED ORDER — HYDROCODONE-ACETAMINOPHEN 5-325 MG PO TABS: ORAL_TABLET | ORAL | 0 refills | Status: DC

## 2018-10-10 MED ORDER — COLCHICINE 0.6 MG PO TABS: ORAL_TABLET | ORAL | 0 refills | Status: DC

## 2018-10-11 LAB — TIQ-NTM

## 2018-10-11 LAB — SYNOVIAL CELL COUNT + DIFF, W/ CRYSTALS
BASOPHILS, %: 0 %
Eosinophils-Synovial: 0 % (ref 0–2)
LYMPHOCYTES-SYNOVIAL FLD: 3 % (ref 0–74)
Monocyte/Macrophage: 5 % (ref 0–69)
NEUTROPHIL, SYNOVIAL: 92 % — AB (ref 0–24)
Synoviocytes, %: 0 % (ref 0–15)
WBC, SYNOVIAL: 9378 {cells}/uL — AB (ref <150)

## 2018-10-13 ENCOUNTER — Encounter (INDEPENDENT_AMBULATORY_CARE_PROVIDER_SITE_OTHER): Payer: Self-pay | Admitting: Orthopedic Surgery

## 2018-10-13 DIAGNOSIS — M25461 Effusion, right knee: Secondary | ICD-10-CM

## 2018-10-13 MED ORDER — BUPIVACAINE HCL 0.25 % IJ SOLN
4.0000 mL | INTRAMUSCULAR | Status: AC | PRN
  Administered 2018-10-13: 4 mL via INTRA_ARTICULAR

## 2018-10-13 MED ORDER — METHYLPREDNISOLONE ACETATE 40 MG/ML IJ SUSP
40.0000 mg | INTRAMUSCULAR | Status: AC | PRN
  Administered 2018-10-13: 40 mg via INTRA_ARTICULAR

## 2018-10-13 MED ORDER — LIDOCAINE HCL 1 % IJ SOLN
5.0000 mL | INTRAMUSCULAR | Status: AC | PRN
  Administered 2018-10-13: 5 mL

## 2018-10-13 NOTE — Progress Notes (Signed)
Office Visit Note   Patient: Walter [name]           Date of Birth: May 19, 1966           MRN: [id number] Visit Date: 10/10/2018 Requested by: Donita Brooks, MD [address] [place], [place] [address] PCP: Donita Brooks, MD  Subjective: Chief Complaint  Patient presents with  . Right Knee - Pain    HPI: Legend is a patient with right knee pain.  Started last week.  He actually twisted it in the shower.  Reports swelling weakness and giving way as well as pain to weight-bear.  He did feel a pop in the knee.  He did take some prednisone orally and the swelling diminished.  He does have a history of gout.  His pain is been relatively severe.  Denies any fevers or chills.              ROS: All systems reviewed are negative as they relate to the chief complaint within the history of present illness.  Patient denies  fevers or chills.   Assessment & Plan: Visit Diagnoses:  1. Acute pain of right knee     Plan: Impression is acute pain right knee with possible gout or pseudogout in the knee.  The knee is aspirated today and the fluid is sent.  At the time of this dictation the fluid is positive for both uric acid and calcium pyrophosphate crystals.  Cortisone was reinjected into the knee joint for relief.  Out of work for Monday and Tuesday.  Needs MRI scan of that right knee for the possibility of a meniscal tear based on his history of injury.  I will see him back after that study.  Also prescribed him Norco No. 20 as well as colchicine to take 0.6 mg 3 times daily for 2 days then daily for 5 days for the acute flareup portion of this gout.  Follow-Up Instructions: Return for after MRI.   Orders:  Orders Placed This Encounter  Procedures  . XR KNEE 3 VIEW RIGHT  . MR Knee Right w/o contrast  . Cell count + diff,  w/ cryst-synvl fld  . TIQ-NTM   Meds ordered this encounter  Medications  . HYDROcodone-acetaminophen (NORCO/VICODIN) 5-325 MG tablet    Sig: 1 po q 8 hrs  prn pain    Dispense:  20 tablet    Refill:  0  . colchicine 0.6 MG tablet    Sig: 1 po tid x 2 days then 1 po q d x 5 days    Dispense:  20 tablet    Refill:  0      Procedures: Large Joint Inj: R knee on 10/13/2018 12:23 PM Indications: diagnostic evaluation, joint swelling and pain Details: 18 G 1.5 in needle, superolateral approach  Arthrogram: No  Medications: 5 mL lidocaine 1 %; 40 mg methylPREDNISolone acetate 40 MG/ML; 4 mL bupivacaine 0.25 % Aspirate: 40 mL cloudy; sent for lab analysis Outcome: tolerated well, no immediate complications Procedure, treatment alternatives, risks and benefits explained, specific risks discussed. Consent was given by the patient. Immediately prior to procedure a time out was called to verify the correct patient, procedure, equipment, support staff and site/side marked as required. Patient was prepped and draped in the usual sterile fashion.       Clinical Data: No additional findings.  Objective: Vital Signs: There were no vitals taken for this visit.  Physical Exam:   Constitutional: Patient  appears well-developed HEENT:  Head: Normocephalic Eyes:EOM are normal Neck: Normal range of motion Cardiovascular: Normal rate Pulmonary/chest: Effort normal Neurologic: Patient is alert Skin: Skin is warm Psychiatric: Patient has normal mood and affect    Ortho Exam: Ortho exam demonstrates antalgic gait to the right.  There is a mild to moderate effusion.  Extensor mechanism is intact.  Collateral crucial ligaments are stable.  No masses lymph adenopathy or skin changes noted in that right knee region but it is painful for him to bend it.  There is warmth to the right knee compared to the left knee.  There is some medial and lateral joint line tenderness present.  Specialty Comments:  No specialty comments available.  Imaging: No results found.   PMFS History: Patient Active Problem List   Diagnosis Date Noted  . Gout    Past  Medical History:  Diagnosis Date  . Gout     History reviewed. No pertinent family history.  History reviewed. No pertinent surgical history. Social History   Occupational History  . Not on file  Tobacco Use  . Smoking status: Never Smoker  . Smokeless tobacco: Never Used  Substance and Sexual Activity  . Alcohol use: No  . Drug use: No  . Sexual activity: Not on file

## 2018-10-19 ENCOUNTER — Encounter (INDEPENDENT_AMBULATORY_CARE_PROVIDER_SITE_OTHER): Payer: Self-pay | Admitting: Orthopedic Surgery

## 2018-10-20 NOTE — Telephone Encounter (Signed)
No infxn just gout lets wait on mri

## 2018-10-25 ENCOUNTER — Ambulatory Visit
Admission: RE | Admit: 2018-10-25 | Discharge: 2018-10-25 | Disposition: A | Discharge status: Home / Self Care | Payer: Self-pay | Source: Ambulatory Visit | Attending: Orthopedic Surgery | Admitting: Orthopedic Surgery

## 2018-10-25 DIAGNOSIS — M25561 Pain in right knee: Secondary | ICD-10-CM

## 2018-10-27 ENCOUNTER — Telehealth (INDEPENDENT_AMBULATORY_CARE_PROVIDER_SITE_OTHER): Payer: Self-pay | Admitting: Orthopedic Surgery

## 2018-10-27 ENCOUNTER — Telehealth (INDEPENDENT_AMBULATORY_CARE_PROVIDER_SITE_OTHER): Payer: Self-pay

## 2018-10-27 NOTE — Telephone Encounter (Signed)
ok 

## 2018-10-27 NOTE — Telephone Encounter (Signed)
Unable to do MRI due to pain and anxiety. GI suggested he call for a RX for pain and anxiety,  Please call patient to advise [phone]

## 2018-10-27 NOTE — Telephone Encounter (Signed)
Ok to change from colchicine to mitigare?

## 2018-10-27 NOTE — Telephone Encounter (Signed)
Please advise 

## 2018-10-27 NOTE — Telephone Encounter (Signed)
Ok for valium 5 po q 1 hour before repeat prn # 4

## 2018-10-28 ENCOUNTER — Encounter (INDEPENDENT_AMBULATORY_CARE_PROVIDER_SITE_OTHER): Payer: Self-pay | Admitting: Orthopedic Surgery

## 2018-10-28 ENCOUNTER — Other Ambulatory Visit (INDEPENDENT_AMBULATORY_CARE_PROVIDER_SITE_OTHER): Payer: Self-pay | Admitting: Orthopedic Surgery

## 2018-10-28 DIAGNOSIS — M25561 Pain in right knee: Secondary | ICD-10-CM

## 2018-10-28 MED ORDER — COLCHICINE 0.6 MG PO CAPS: ORAL_CAPSULE | ORAL | 0 refills | Status: DC

## 2018-10-28 MED ORDER — DIAZEPAM 5 MG PO TABS: ORAL_TABLET | ORAL | 0 refills | Status: DC

## 2018-10-28 NOTE — Telephone Encounter (Signed)
[facility] patient and advised called in.

## 2018-10-28 NOTE — Telephone Encounter (Signed)
Okay for Tylenol 3 1 p.o. every 8 hours as needed pain #20 with no refills.  Please call thanks

## 2018-10-28 NOTE — Telephone Encounter (Signed)
Sent to pharmacy.    Patient is asking for medication for pain. Please advise.

## 2018-10-28 NOTE — Addendum Note (Signed)
Addended by: Cherre Huger E on: 10/28/2018 12:20 PM   Modules accepted: Orders

## 2018-10-30 MED ORDER — ACETAMINOPHEN-CODEINE #3 300-30 MG PO TABS: 1.0000 | ORAL_TABLET | Freq: Three times a day (TID) | ORAL | 0 refills | Status: DC | PRN

## 2018-10-30 NOTE — Addendum Note (Signed)
Addended by: Cherre Huger E on: 10/30/2018 09:19 AM   Modules accepted: Orders

## 2018-10-30 NOTE — Telephone Encounter (Signed)
[facility] pharm LMVM with Rx, asked them to call patient and advise when ready.

## 2018-11-03 ENCOUNTER — Ambulatory Visit
Admission: RE | Admit: 2018-11-03 | Discharge: 2018-11-03 | Disposition: A | Discharge status: Home / Self Care | Payer: 59 | Source: Ambulatory Visit | Attending: Orthopedic Surgery | Admitting: Orthopedic Surgery

## 2018-11-03 DIAGNOSIS — M25561 Pain in right knee: Secondary | ICD-10-CM

## 2018-11-20 ENCOUNTER — Encounter (INDEPENDENT_AMBULATORY_CARE_PROVIDER_SITE_OTHER): Payer: Self-pay | Admitting: Orthopedic Surgery

## 2018-11-20 ENCOUNTER — Ambulatory Visit (INDEPENDENT_AMBULATORY_CARE_PROVIDER_SITE_OTHER): Payer: 59 | Admitting: Orthopedic Surgery

## 2018-11-20 DIAGNOSIS — M25561 Pain in right knee: Secondary | ICD-10-CM | POA: Diagnosis not present

## 2018-11-23 ENCOUNTER — Encounter (INDEPENDENT_AMBULATORY_CARE_PROVIDER_SITE_OTHER): Payer: Self-pay | Admitting: Orthopedic Surgery

## 2018-11-23 NOTE — Progress Notes (Signed)
Office Visit Note   Patient: Walter [name]           Date of Birth: Mar 25, 1966           MRN: [id number] Visit Date: 11/20/2018 Requested by: Donita Brooks, MD [address] [place], [place] [address] PCP: Donita Brooks, MD  Subjective: Chief Complaint  Patient presents with  . Right Knee - Follow-up    HPI: Walter [name] is a patient with right knee pain.  Since of seen him he is had an MRI scan.  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  The injection did help his symptoms.  MRI scan is reviewed and I called and discussed it with a radiologist.  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  Menisci are intact.  These areas of synovitis do not appear to be gouty tophi.              ROS: All systems reviewed are negative as they relate to the chief complaint within the history of present illness.  Patient denies  fevers or chills.   Assessment & Plan: Visit Diagnoses:  1. 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  He may go on gout medication per his primary care doctor.  Currently he does not have much of an effusion.  Discussed with him today operative and nonoperative management of this problem.  He is less likely to go with operative management at this time because symptomatically he is doing well.  I do think that that is going to likely change at sometime in the future.  My recommendation would be arthroscopic excision of that PVNS even though it does carry about a 10% recurrence rate.  Another option would be repeat scanning of the knee in 6 months to see if this is changed.  Patient elects for the latter option and we will arrange that scan and see him back at that time.  Follow-Up Instructions: Return in about 6 months (around 05/21/2019).   Orders:  Orders Placed This Encounter  Procedures  . MR Knee Right w/o contrast   No orders of  the defined types were placed in this encounter.     Procedures: No procedures performed   Clinical Data: No additional findings.  Objective: Vital Signs: There were no vitals taken for this visit.  Physical Exam:   Constitutional: Patient appears well-developed HEENT:  Head: Normocephalic Eyes:EOM are normal Neck: Normal range of motion Cardiovascular: Normal rate Pulmonary/chest: Effort normal Neurologic: Patient is alert Skin: Skin is warm Psychiatric: Patient has normal mood and affect    Ortho Exam: Ortho exam demonstrates full active and passive range of motion of that right knee with no effusion today.  No warmth is present.  Collateral cruciate ligaments are stable.  No proximal lymphadenopathy noted.  No discrete joint line tenderness is present.  Pedal pulses palpable.  No other masses lymphadenopathy or skin changes noted in that right knee region.  Extensor mechanism is intact.  Specialty Comments:  No specialty comments available.  Imaging: No results found.   PMFS History: Patient Active Problem List   Diagnosis Date Noted  . Gout    Past Medical History:  Diagnosis Date  . Gout     History reviewed. No pertinent family history.  History reviewed. No pertinent surgical history. Social History  Occupational History  . Not on file  Tobacco Use  . Smoking status: Never Smoker  . Smokeless tobacco: Never Used  Substance and Sexual Activity  . Alcohol use: No  . Drug use: No  . Sexual activity: Not on file

## 2019-05-08 ENCOUNTER — Ambulatory Visit: Payer: 59 | Admitting: Family Medicine

## 2019-05-08 ENCOUNTER — Other Ambulatory Visit: Payer: Self-pay

## 2019-05-08 ENCOUNTER — Encounter: Payer: Self-pay | Admitting: Family Medicine

## 2019-05-08 ENCOUNTER — Other Ambulatory Visit (INDEPENDENT_AMBULATORY_CARE_PROVIDER_SITE_OTHER): Payer: Self-pay | Admitting: Orthopedic Surgery

## 2019-05-08 VITALS — BP 130/76 | HR 96 | Temp 99.5°F | Resp 16 | Ht 75.0 in | Wt 325.0 lb

## 2019-05-08 DIAGNOSIS — Z8739 Personal history of other diseases of the musculoskeletal system and connective tissue: Secondary | ICD-10-CM | POA: Diagnosis not present

## 2019-05-08 DIAGNOSIS — R7303 Prediabetes: Secondary | ICD-10-CM

## 2019-05-08 MED ORDER — COLCHICINE 0.6 MG PO CAPS: ORAL_CAPSULE | ORAL | 0 refills | Status: DC

## 2019-05-08 MED ORDER — HYDROCODONE-ACETAMINOPHEN 5-325 MG PO TABS: 1.0000 | ORAL_TABLET | Freq: Four times a day (QID) | ORAL | 0 refills | Status: DC | PRN

## 2019-05-08 MED ORDER — PREDNISONE 20 MG PO TABS: ORAL_TABLET | ORAL | 0 refills | Status: DC

## 2019-05-08 NOTE — Telephone Encounter (Signed)
This is a GD pt 

## 2019-05-08 NOTE — Telephone Encounter (Signed)
y

## 2019-05-08 NOTE — Progress Notes (Signed)
Subjective:    Patient ID: Walter [name], male    DOB: Apr 05, 1966, 53 y.o.   MRN: [id number]  HPI  Patient is having another gout flare in his right knee.  He recently called his orthopedist who put him on colchicine however the pain is severe.  He has pain even stepping down to put weight on his right knee.  He denies any falls or injuries.  The last time anyone checked a uric acid level was in 2015 and at that time it was greater than 9.  At that time he was also found to be a prediabetic with a hemoglobin A1c of 6.2.  He hates to give blood and he is hesitant to allow me to check any blood but I pleaded with the patient to allow me to check it.  He gets gout attacks 2-3 times a year.  He is finally interested in taking a preventative medicine to help prevent the attacks from occurring. Past Medical History:  Diagnosis Date  . Gout    No past surgical history on file. Current Outpatient Medications on File Prior to Visit  Medication Sig Dispense Refill  . acetaminophen-codeine (TYLENOL #3) 300-30 MG tablet Take 1 tablet by mouth every 8 (eight) hours as needed for moderate pain. 20 tablet 0  . Colchicine (MITIGARE) 0.6 MG CAPS Take 1 tablet TID x 3 days, then 1 po QD x 5 days. 30 capsule 0  . diazepam (VALIUM) 5 MG tablet Take 1 tablet 1 hour prior to MRI, repeat if needed. 4 tablet 0   No current facility-administered medications on file prior to visit.    No Known Allergies Social History   Socioeconomic History  . Marital status: Married    Spouse name: Not on file  . Number of children: Not on file  . Years of education: Not on file  . Highest education level: Not on file  Occupational History  . Not on file  Social Needs  . Financial resource strain: Not on file  . Food insecurity    Worry: Not on file    Inability: Not on file  . Transportation needs    Medical: Not on file    Non-medical: Not on file  Tobacco Use  . Smoking status: Never Smoker  . Smokeless  tobacco: Never Used  Substance and Sexual Activity  . Alcohol use: No  . Drug use: No  . Sexual activity: Not on file  Lifestyle  . Physical activity    Days per week: Not on file    Minutes per session: Not on file  . Stress: Not on file  Relationships  . Social Musician on phone: Not on file    Gets together: Not on file    Attends religious service: Not on file    Active member of club or organization: Not on file    Attends meetings of clubs or organizations: Not on file    Relationship status: Not on file  . Intimate partner violence    Fear of current or ex partner: Not on file    Emotionally abused: Not on file    Physically abused: Not on file    Forced sexual activity: Not on file  Other Topics Concern  . Not on file  Social History Narrative  . Not on file     Review of Systems  All other systems reviewed and are negative.      Objective:   Physical Exam  Vitals signs reviewed.  Cardiovascular:     Rate and Rhythm: Normal rate and regular rhythm.     Heart sounds: Normal heart sounds.  Pulmonary:     Effort: Pulmonary effort is normal.     Breath sounds: Normal breath sounds.  Musculoskeletal:     Right knee: He exhibits decreased range of motion, swelling and abnormal meniscus. He exhibits no erythema. Tenderness found. Lateral joint line tenderness noted. No MCL and no LCL tenderness noted.           Assessment & Plan:  The primary encounter diagnosis was History of gout. A diagnosis of Prediabetes was also pertinent to this visit. Patient is having an acute gout flare in his right knee.  I will give the patient a prednisone taper pack due to the severity of his pain and also Norco 5/325 1 p.o. every 6 hours as needed pain.  I spent 15 minutes with the patient today discussing the natural history of gout.  I will check a uric acid level along with his liver function test and renal function.  If possible, once he is through this gout  attack, I would recommend starting allopurinol at 300 mg a day in an effort to drive the uric acid in his blood less than 6.  I would have him take colchicine 0.6 mg daily with the allopurinol until uric acid levels have reached steady state concentration.  While checking lab work I will also monitor his prediabetes.

## 2019-05-08 NOTE — Telephone Encounter (Signed)
Ok to rf? 

## 2019-05-09 LAB — CBC WITH DIFFERENTIAL/PLATELET
Absolute Monocytes: 546 cells/uL (ref 200–950)
Basophils Absolute: 84 cells/uL (ref 0–200)
Basophils Relative: 0.8 %
Eosinophils Absolute: 32 cells/uL (ref 15–500)
Eosinophils Relative: 0.3 %
HCT: 41 % (ref 38.5–50.0)
Hemoglobin: 13.3 g/dL (ref 13.2–17.1)
Lymphs Abs: 1712 cells/uL (ref 850–3900)
MCH: 29.4 pg (ref 27.0–33.0)
MCHC: 32.4 g/dL (ref 32.0–36.0)
MCV: 90.7 fL (ref 80.0–100.0)
MPV: 10.2 fL (ref 7.5–12.5)
Monocytes Relative: 5.2 %
Neutro Abs: 8127 cells/uL — ABNORMAL HIGH (ref 1500–7800)
Neutrophils Relative %: 77.4 %
Platelets: 363 10*3/uL (ref 140–400)
RBC: 4.52 10*6/uL (ref 4.20–5.80)
RDW: 13 % (ref 11.0–15.0)
Total Lymphocyte: 16.3 %
WBC: 10.5 10*3/uL (ref 3.8–10.8)

## 2019-05-09 LAB — COMPLETE METABOLIC PANEL WITH GFR
AG Ratio: 1.3 (calc) (ref 1.0–2.5)
ALT: 11 U/L (ref 9–46)
AST: 14 U/L (ref 10–35)
Albumin: 4.2 g/dL (ref 3.6–5.1)
Alkaline phosphatase (APISO): 76 U/L (ref 35–144)
BUN: 11 mg/dL (ref 7–25)
CO2: 30 mmol/L (ref 20–32)
Calcium: 9.9 mg/dL (ref 8.6–10.3)
Chloride: 103 mmol/L (ref 98–110)
Creat: 1.01 mg/dL (ref 0.70–1.33)
GFR, Est African American: 97 mL/min/{1.73_m2} (ref >=60)
GFR, Est Non African American: 84 mL/min/{1.73_m2} (ref >=60)
Globulin: 3.2 g/dL (calc) (ref 1.9–3.7)
Glucose, Bld: 135 mg/dL — ABNORMAL HIGH (ref 65–99)
Potassium: 4.9 mmol/L (ref 3.5–5.3)
Sodium: 142 mmol/L (ref 135–146)
Total Bilirubin: 0.5 mg/dL (ref 0.2–1.2)
Total Protein: 7.4 g/dL (ref 6.1–8.1)

## 2019-05-09 LAB — HEMOGLOBIN A1C
Hgb A1c MFr Bld: 6.1 % of total Hgb — ABNORMAL HIGH (ref <5.7)
Mean Plasma Glucose: 128 (calc)
eAG (mmol/L): 7.1 (calc)

## 2019-05-09 LAB — URIC ACID: Uric Acid, Serum: 10.5 mg/dL — ABNORMAL HIGH (ref 4.0–8.0)

## 2019-05-11 ENCOUNTER — Encounter: Payer: Self-pay | Admitting: Family Medicine

## 2019-05-11 ENCOUNTER — Other Ambulatory Visit: Payer: Self-pay | Admitting: Family Medicine

## 2019-05-11 DIAGNOSIS — R7303 Prediabetes: Secondary | ICD-10-CM | POA: Insufficient documentation

## 2019-05-11 DIAGNOSIS — E790 Hyperuricemia without signs of inflammatory arthritis and tophaceous disease: Secondary | ICD-10-CM

## 2019-05-11 DIAGNOSIS — M109 Gout, unspecified: Secondary | ICD-10-CM

## 2019-05-11 MED ORDER — ALLOPURINOL 300 MG PO TABS: 300.0000 mg | ORAL_TABLET | Freq: Every day | ORAL | 3 refills | Status: DC

## 2019-05-11 MED ORDER — COLCHICINE 0.6 MG PO CAPS: 0.6000 mg | ORAL_CAPSULE | Freq: Every day | ORAL | 1 refills | Status: DC

## 2019-05-11 NOTE — Addendum Note (Signed)
Addended by: Shary Decamp B on: 05/11/2019 12:41 PM   Modules accepted: Orders

## 2019-09-11 ENCOUNTER — Ambulatory Visit (INDEPENDENT_AMBULATORY_CARE_PROVIDER_SITE_OTHER): Payer: 59 | Admitting: Family Medicine

## 2019-09-11 ENCOUNTER — Other Ambulatory Visit: Payer: Self-pay

## 2019-09-11 DIAGNOSIS — J208 Acute bronchitis due to other specified organisms: Secondary | ICD-10-CM

## 2019-09-11 DIAGNOSIS — J209 Acute bronchitis, unspecified: Secondary | ICD-10-CM

## 2019-09-11 MED ORDER — HYDROCODONE-HOMATROPINE 5-1.5 MG PO TABS: 1.0000 | ORAL_TABLET | Freq: Four times a day (QID) | ORAL | 0 refills | Status: DC | PRN

## 2019-09-11 MED ORDER — AZITHROMYCIN 250 MG PO TABS: ORAL_TABLET | ORAL | 0 refills | Status: DC

## 2019-09-11 NOTE — Progress Notes (Signed)
Subjective:    Patient ID: Walter [name], male    DOB: June 11, 1966, 53 y.o.   MRN: [id number]  HPI  Patient is being seen today as a telephone visit.  He consents to be seen over the telephone.  Phone call began at 415.  Phone call concluded at 428.  Patient states for 3 or 4 weeks he has had a cough.  Cough has been persistent and hacking.  He denies any shortness of breath.  He denies any fever.  He denies any chills.  He denies any chest pain.  He denies any hemoptysis.  He does report clear sputum at times.  He denies any purulent sputum.  He denies any fatigue or weakness.  He does not believe he has been exposed to COVID-19.  He denies any angina.  He denies any nausea or vomiting or diarrhea.  He denies any sinus pain or rhinorrhea or sore throat. Past Medical History:  Diagnosis Date  . Gout   . Prediabetes    No past surgical history on file. Current Outpatient Medications on File Prior to Visit  Medication Sig Dispense Refill  . acetaminophen-codeine (TYLENOL #3) 300-30 MG tablet Take 1 tablet by mouth every 8 (eight) hours as needed for moderate pain. 20 tablet 0  . allopurinol (ZYLOPRIM) 300 MG tablet Take 1 tablet (300 mg total) by mouth daily. 90 tablet 3  . Colchicine (MITIGARE) 0.6 MG CAPS Take 0.6 mg by mouth daily. 90 capsule 1  . diazepam (VALIUM) 5 MG tablet Take 1 tablet 1 hour prior to MRI, repeat if needed. 4 tablet 0  . HYDROcodone-acetaminophen (NORCO) 5-325 MG tablet Take 1 tablet by mouth every 6 (six) hours as needed for moderate pain. 30 tablet 0  . predniSONE (DELTASONE) 20 MG tablet 3 tabs poqday 1-2, 2 tabs poqday 3-4, 1 tab poqday 5-6 12 tablet 0   No current facility-administered medications on file prior to visit.    No Known Allergies Social History   Socioeconomic History  . Marital status: Married    Spouse name: Not on file  . Number of children: Not on file  . Years of education: Not on file  . Highest education level: Not on file   Occupational History  . Not on file  Social Needs  . Financial resource strain: Not on file  . Food insecurity    Worry: Not on file    Inability: Not on file  . Transportation needs    Medical: Not on file    Non-medical: Not on file  Tobacco Use  . Smoking status: Never Smoker  . Smokeless tobacco: Never Used  Substance and Sexual Activity  . Alcohol use: No  . Drug use: No  . Sexual activity: Not on file  Lifestyle  . Physical activity    Days per week: Not on file    Minutes per session: Not on file  . Stress: Not on file  Relationships  . Social Herbalist on phone: Not on file    Gets together: Not on file    Attends religious service: Not on file    Active member of club or organization: Not on file    Attends meetings of clubs or organizations: Not on file    Relationship status: Not on file  . Intimate partner violence    Fear of current or ex partner: Not on file    Emotionally abused: Not on file    Physically abused: Not  on file    Forced sexual activity: Not on file  Other Topics Concern  . Not on file  Social History Narrative  . Not on file     Review of Systems  All other systems reviewed and are negative.      Objective:   Physical Exam   Patient cannot be examined today due to the fact that was a telephone visit however he is speaking full and complete sentences without respiratory distress     Assessment & Plan:  Acute bronchitis due to infection  I have recommended that the patient go to one of the local [facility] and have a COVID-19 test given the late hour and the fact the hospital testing sites have now closed and its a Friday afternoon.  I have recommended quarantining until the results return.  Meanwhile I will treat the patient for bronchitis with Z-Pak.  I will also give the patient Hycodan tablets 1 p.o. every 6 hours as needed cough.  Reassess next week if no better or sooner if worse.

## 2019-10-16 ENCOUNTER — Other Ambulatory Visit: Payer: Self-pay | Admitting: Family Medicine

## 2019-11-03 IMAGING — MR MR KNEE*R* W/O CM
4 of 6 series · 23 of 40 positions shown · non-contrast
Comparison: None.

CLINICAL DATA: Right knee pain since an injury when the patient
stepped out of a bath of 4 weeks ago.

EXAM:
MRI OF THE RIGHT KNEE WITHOUT CONTRAST
TECHNIQUE: Multiplanar, multisequence MR imaging of the knee was performed. No
intravenous contrast was administered.

[Series 7: T1 · coronal · 4.0mm · 0.29mm/px · 3 of 29 slices shown]
[im 6/29]
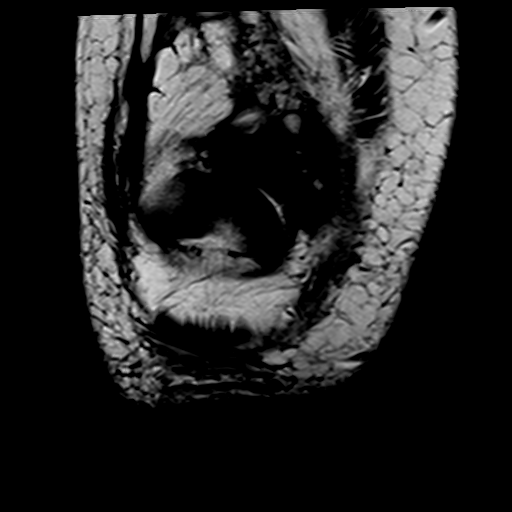
[im 17/29]
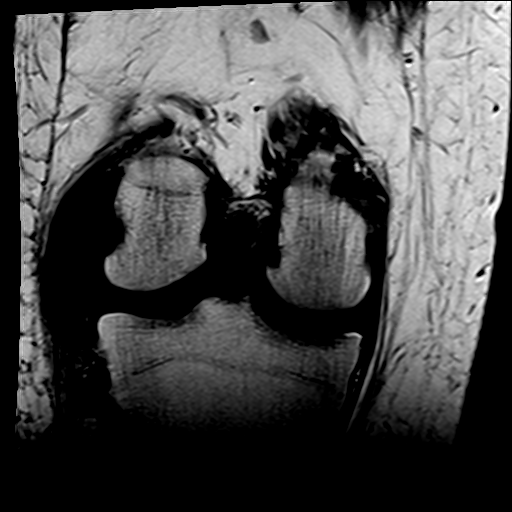
[im 29/29]
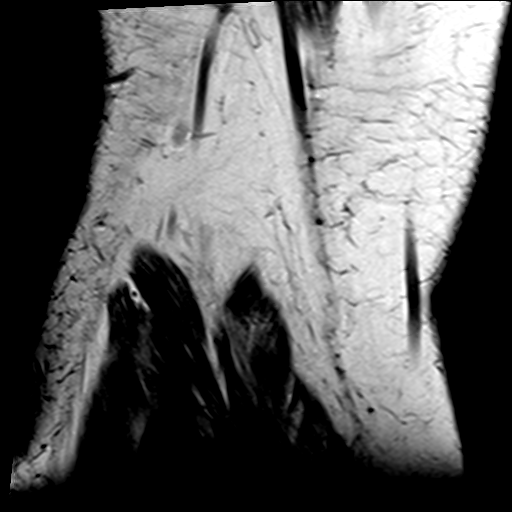

[Series 8: T2 fat-sat · coronal · 4.0mm · 0.59mm/px · 6 of 29 slices shown (1 of 2)]
[im 1/29]
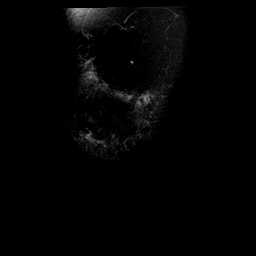
[im 6/29]
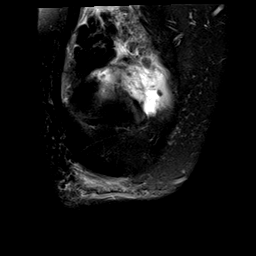
[im 12/29]
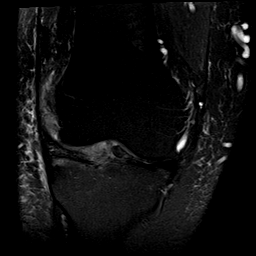
[im 17/29]
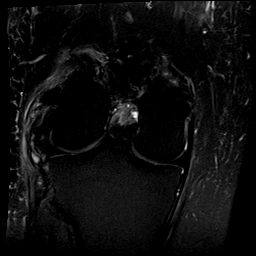
[im 23/29]
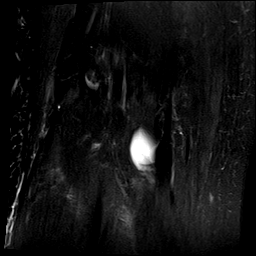
[im 29/29]
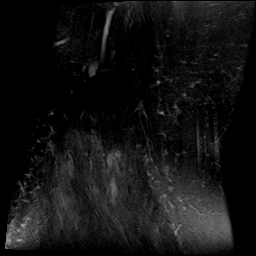

[Series 10: PD fat-sat · sagittal · 3.0mm · 0.31mm/px · 7 of 31 slices shown]
[im 1/31]
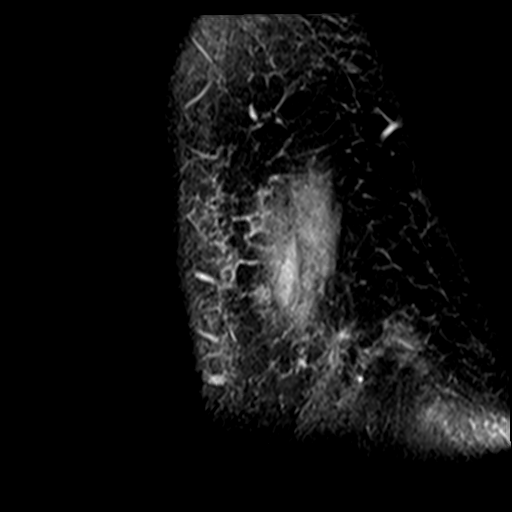
[im 6/31]
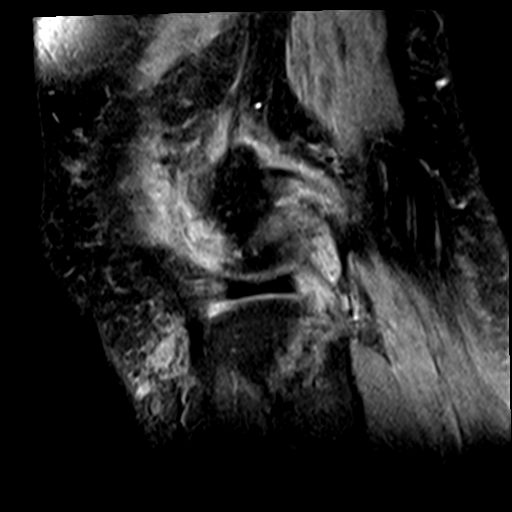
[im 11/31]
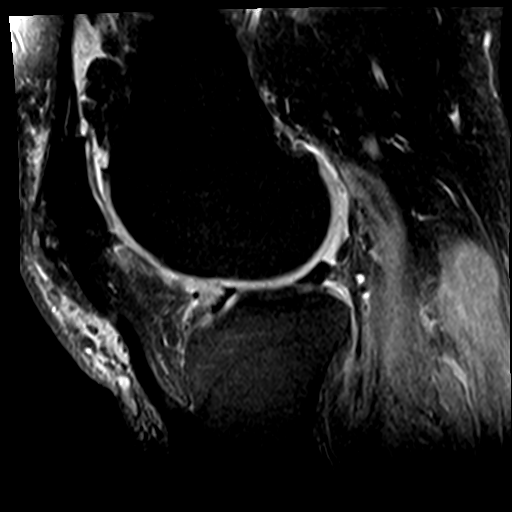
[im 16/31]
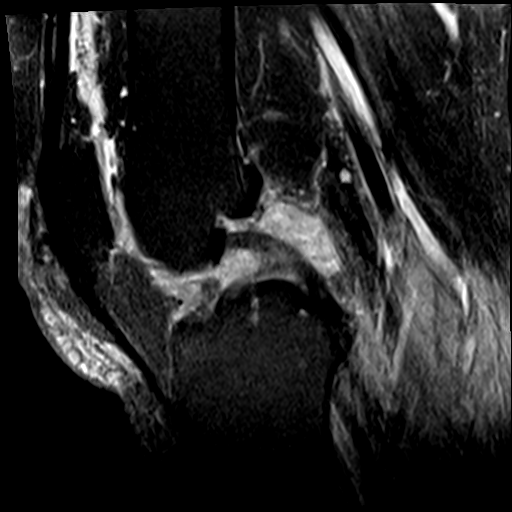
[im 21/31]
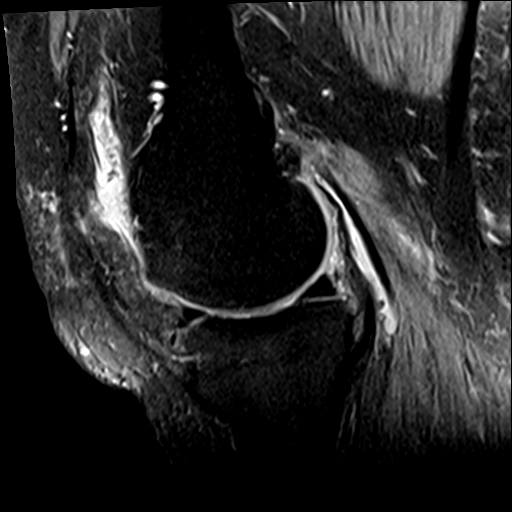
[im 26/31]
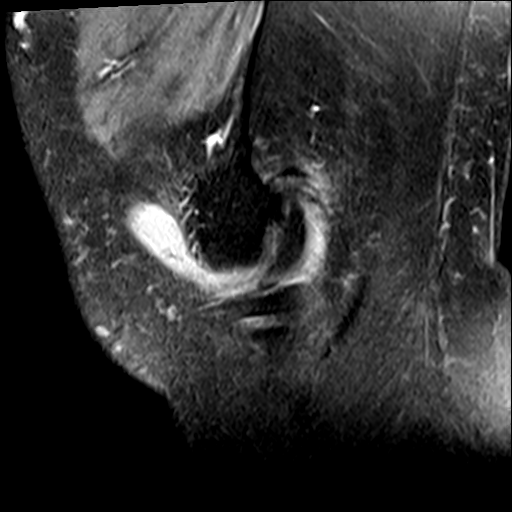
[im 31/31]
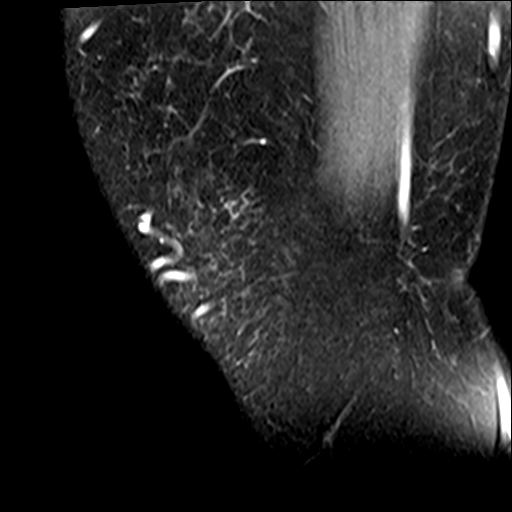

[Series 11: T2 fat-sat · sagittal · 3.0mm · 0.31mm/px · 7 of 31 slices shown (2 of 2)]
[im 1/31]
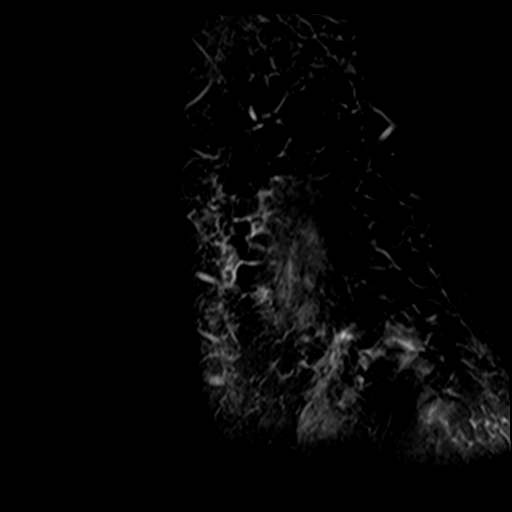
[im 6/31]
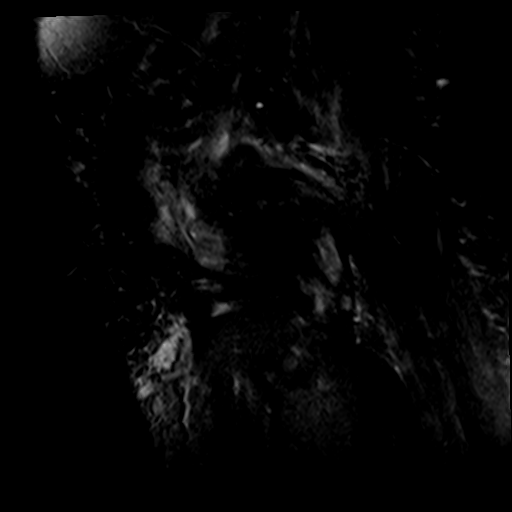
[im 11/31]
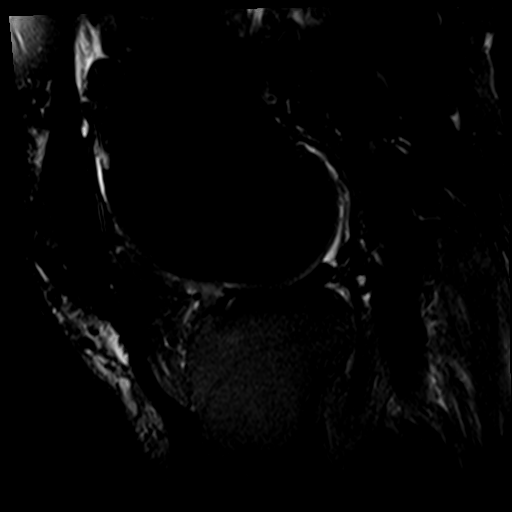
[im 16/31]
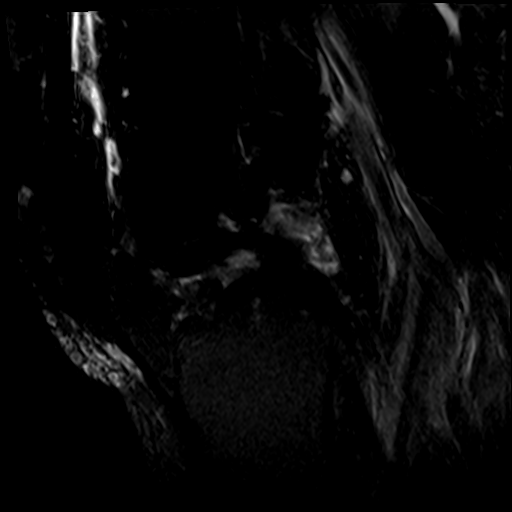
[im 21/31]
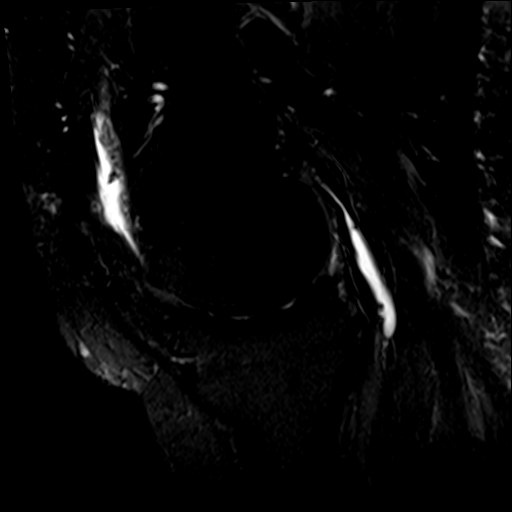
[im 26/31]
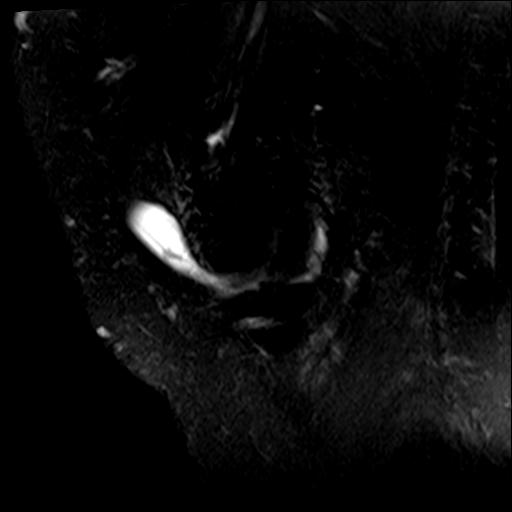
[im 31/31]
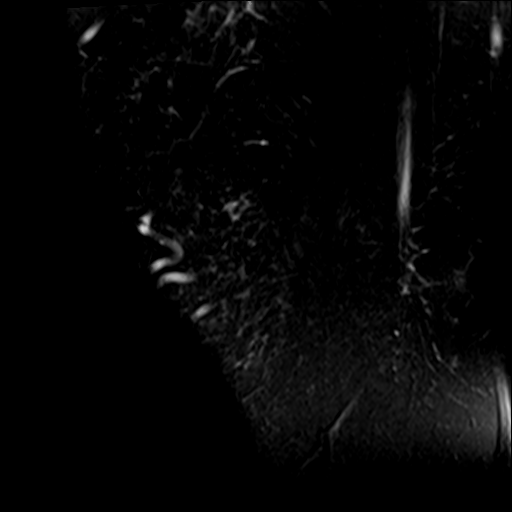

[23 of 40 positions shown; findings below may reference images not displayed]

FINDINGS: MENISCI

Medial meniscus:  Intact.

Lateral meniscus:  Intact.

LIGAMENTS

Cruciates:  Intact.

Collaterals:  Intact.

CARTILAGE

Patellofemoral:  Mildly to moderately thinned without focal defect.

Medial:  Mildly degenerated.

Lateral:  Mildly degenerated.

Joint: Mixed signal intensity soft tissue material is seen adjacent
to the lateral femoral condyle measuring approximately 3.5 cm
craniocaudal by 1.9 cm transverse by 4.2 cm AP. Similar focus of
mixed signal intensity is identified in the intercondylar notch of
the femur tracking along the PCL and measuring approximately 1.4 cm
transverse by 2.6 cm AP by 1.2 cm craniocaudal. Similar signal
intensity material seen anterior to the PCL.

Popliteal Fossa:  Small Baker's cyst is noted.

Extensor Mechanism:  Intact.

Bones: No fracture or worrisome lesion. There is some osteophytosis
about the knee.

Other: Subcutaneous edema is seen anterior to the patellar tendon.
IMPRESSION: Mixed signal intensity soft tissue hypertrophy along the lateral
femoral condyle and centrally in the joint has an appearance most
consistent with PVNS.

Negative for meniscal or ligament tear.

Subcutaneous edema anterior to the patellar tendon compatible with
bursitis.

Small Baker's cyst.

## 2020-03-24 ENCOUNTER — Telehealth: Payer: Self-pay | Admitting: Family Medicine

## 2020-03-24 NOTE — Telephone Encounter (Signed)
Cb# [phone] Pt had some shell fish know he has develop gout need refill on prednisone

## 2020-03-25 ENCOUNTER — Other Ambulatory Visit: Payer: Self-pay | Admitting: Family Medicine

## 2020-03-25 MED ORDER — PREDNISONE 20 MG PO TABS: ORAL_TABLET | ORAL | 0 refills | Status: DC

## 2020-03-25 NOTE — Telephone Encounter (Signed)
[facility] On [facility]

## 2020-04-19 ENCOUNTER — Other Ambulatory Visit: Payer: Self-pay

## 2020-04-19 ENCOUNTER — Ambulatory Visit: Payer: 59 | Admitting: Nurse Practitioner

## 2020-04-19 VITALS — BP 110/62 | HR 105 | Temp 97.6°F | Resp 18 | Wt 367.6 lb

## 2020-04-19 DIAGNOSIS — M25561 Pain in right knee: Secondary | ICD-10-CM

## 2020-04-19 DIAGNOSIS — M109 Gout, unspecified: Secondary | ICD-10-CM

## 2020-04-19 MED ORDER — PREDNISONE 10 MG PO TABS: ORAL_TABLET | ORAL | 0 refills | Status: DC

## 2020-04-19 MED ORDER — COLCHICINE 0.6 MG PO CAPS: ORAL_CAPSULE | ORAL | 3 refills | Status: DC

## 2020-04-19 MED ORDER — KETOROLAC TROMETHAMINE 60 MG/2ML IM SOLN
60.0000 mg | Freq: Once | INTRAMUSCULAR | Status: AC
  Administered 2020-04-19: 60 mg via INTRAMUSCULAR

## 2020-04-19 NOTE — Progress Notes (Signed)
Established Patient Office Visit  Subjective:  Patient ID: Walter [name], male    DOB: Apr 06, 1966  Age: 54 y.o. MRN: [id number]  CC:  Chief Complaint  Patient presents with  . Gout    R knee, started x10 days, prednisone was taken     HPI BLANCHE GALLIEN is a 54 year old male presenting with 10 days of symptoms of pain to his right knee.  He has a history of gout to the right knee and feels that this is a gout flare.  He had a gout flare to the right knee a month ago when he had prednisone.  He has been using his for now mainly he has run out of his colchicine.  He would like to start taking colchicine again. Patient denies fever or chills or recent falls.  Past Medical History:  Diagnosis Date  . Gout   . Prediabetes     No past surgical history on file.  No family history on file.  Social History   Socioeconomic History  . Marital status: Married    Spouse name: Not on file  . Number of children: Not on file  . Years of education: Not on file  . Highest education level: Not on file  Occupational History  . Not on file  Tobacco Use  . Smoking status: Never Smoker  . Smokeless tobacco: Never Used  Substance and Sexual Activity  . Alcohol use: No  . Drug use: No  . Sexual activity: Not on file  Other Topics Concern  . Not on file  Social History Narrative  . Not on file   Social Determinants of Health   Financial Resource Strain:   . Difficulty of Paying Living Expenses:   Food Insecurity:   . Worried About Charity fundraiser in the Last Year:   . Arboriculturist in the Last Year:   Transportation Needs:   . Film/video editor (Medical):   Marland Kitchen Lack of Transportation (Non-Medical):   Physical Activity:   . Days of Exercise per Week:   . Minutes of Exercise per Session:   Stress:   . Feeling of Stress :   Social Connections:   . Frequency of Communication with Friends and Family:   . Frequency of Social Gatherings with Friends and Family:   .  Attends Religious Services:   . Active Member of Clubs or Organizations:   . Attends Archivist Meetings:   Marland Kitchen Marital Status:   Intimate Partner Violence:   . Fear of Current or Ex-Partner:   . Emotionally Abused:   Marland Kitchen Physically Abused:   . Sexually Abused:     Outpatient Medications Prior to Visit  Medication Sig Dispense Refill  . acetaminophen-codeine (TYLENOL #3) 300-30 MG tablet Take 1 tablet by mouth every 8 (eight) hours as needed for moderate pain. 20 tablet 0  . allopurinol (ZYLOPRIM) 300 MG tablet TAKE 1 TABLET BY MOUTH EVERY DAY 30 tablet 11  . Colchicine (MITIGARE) 0.6 MG CAPS Take 0.6 mg by mouth daily. 90 capsule 1  . predniSONE (DELTASONE) 20 MG tablet 3 tabs poqday 1-2, 2 tabs poqday 3-4, 1 tab poqday 5-6 12 tablet 0  . azithromycin (ZITHROMAX) 250 MG tablet 2 tabs poqday1, 1 tab poqday 2-5 6 tablet 0  . diazepam (VALIUM) 5 MG tablet Take 1 tablet 1 hour prior to MRI, repeat if needed. 4 tablet 0  . HYDROcodone-acetaminophen (NORCO) 5-325 MG tablet Take 1 tablet by mouth every  6 (six) hours as needed for moderate pain. 30 tablet 0  . HYDROcodone-Homatropine 5-1.5 MG TABS Take 1 tablet by mouth 4 (four) times daily as needed (cough). 30 tablet 0   No facility-administered medications prior to visit.    No Known Allergies  ROS Review of Systems  All other systems reviewed and are negative.     Objective:    Physical Exam  Constitutional: He is oriented to person, place, and time. Vital signs are normal. He appears well-developed and well-nourished.  Non-toxic appearance. He does not have a sickly appearance. He does not appear ill.  HENT:  Head: Normocephalic.  Right Ear: Hearing and ear canal normal.  Left Ear: Hearing and ear canal normal.  Eyes: Pupils are equal, round, and reactive to light. Conjunctivae, EOM and lids are normal. Lids are everted and swept, no foreign bodies found.  Cardiovascular: Normal rate and normal pulses.  Pulmonary/Chest:  Effort normal.  Abdominal: There is no hepatosplenomegaly.  Musculoskeletal:        General: No edema.     Cervical back: Normal range of motion and neck supple.     Right knee: Swelling present. No effusion, erythema, ecchymosis or bony tenderness. Decreased range of motion. Tenderness present. No lateral joint line, MCL or LCL tenderness. Normal alignment and normal meniscus.  Neurological: He is alert and oriented to person, place, and time. He has normal strength. He displays a negative Romberg sign.  Skin: Skin is warm and dry.  Psychiatric: He has a normal mood and affect. His speech is normal and behavior is normal. Judgment and thought content normal. Cognition and memory are normal.  Nursing note and vitals reviewed.   BP 110/62 (BP Location: Left Arm, Patient Position: Sitting, Cuff Size: Large)   Pulse (!) 105   Temp 97.6 F (36.4 C) (Temporal)   Resp 18   Wt (!) 367 lb 9.6 oz (166.7 kg)   SpO2 94%   BMI 45.95 kg/m  Wt Readings from Last 3 Encounters:  04/19/20 (!) 367 lb 9.6 oz (166.7 kg)  05/08/19 (!) 325 lb (147.4 kg)  01/22/18 (!) 343 lb (155.6 kg)     Health Maintenance Due  Topic Date Due  . Hepatitis C Screening  Never done  . COVID-19 Vaccine (1) Never done  . HIV Screening  Never done  . TETANUS/TDAP  Never done  . COLONOSCOPY  Never done    There are no preventive care reminders to display for this patient.  No results found for: TSH Lab Results  Component Value Date   WBC 10.5 05/08/2019   HGB 13.3 05/08/2019   HCT 41.0 05/08/2019   MCV 90.7 05/08/2019   PLT 363 05/08/2019   Lab Results  Component Value Date   NA 142 05/08/2019   K 4.9 05/08/2019   CO2 30 05/08/2019   GLUCOSE 135 (H) 05/08/2019   BUN 11 05/08/2019   CREATININE 1.01 05/08/2019   BILITOT 0.5 05/08/2019   ALKPHOS 75 03/26/2014   AST 14 05/08/2019   ALT 11 05/08/2019   PROT 7.4 05/08/2019   ALBUMIN 4.1 03/26/2014   CALCIUM 9.9 05/08/2019   No results found for:  CHOL No results found for: HDL No results found for: LDLCALC No results found for: TRIG No results found for: CHOLHDL Lab Results  Component Value Date   HGBA1C 6.1 (H) 05/08/2019      Assessment & Plan:   Problem List Items Addressed This Visit      Other  Gout - Primary   Relevant Medications   predniSONE (DELTASONE) 10 MG tablet   Colchicine (MITIGARE) 0.6 MG CAPS    Follow a low purine diet-education print out provided May alternate Ibuprofen every 8 hours 800mg  with Tylenol 1000mg  every 8 hours taken 4 hours apart.   Should take Pepcid 20 mg twice a day with Ibuprofen to protect GI tract with chronic use with your frequent gout attacks.   Continue allopurinol  Will refill Colchicine  RX sent for Prednisone taper dose pack  Meds ordered this encounter  Medications  . ketorolac (TORADOL) injection 60 mg  . predniSONE (DELTASONE) 10 MG tablet    Sig: Take 6 tablets day one; take 5 tablets day two; take 4 tablets day three; take 3 tablets day four; take 2 tablets day five; take 1 tablet day six    Dispense:  21 tablet    Refill:  0  . Colchicine (MITIGARE) 0.6 MG CAPS    Sig: daily    Dispense:  90 capsule    Refill:  3    Pt will be taking this along with the allopurinol    Follow-up: Return if symptoms worsen or fail to improve.    , FNP

## 2020-04-19 NOTE — Patient Instructions (Signed)
May alternate Ibuprofen every 8 hours 800mg  with Tylenol 1000mg  every 8 hours taken 4 hours apart.   Should take Pepcid 20 mg twice a day with Ibuprofen to protect GI tract with chronic use with your frequent gout attacks.   Continue allopurinol  Will refill Colchicine  RX sent for Prednisone taper dose pack

## 2020-07-13 ENCOUNTER — Other Ambulatory Visit: Payer: Self-pay

## 2020-07-13 MED ORDER — ALLOPURINOL 300 MG PO TABS: 300.0000 mg | ORAL_TABLET | Freq: Every day | ORAL | 11 refills | Status: DC

## 2020-10-21 ENCOUNTER — Telehealth: Payer: Self-pay

## 2020-10-21 ENCOUNTER — Other Ambulatory Visit: Payer: Self-pay | Admitting: Family Medicine

## 2020-10-21 ENCOUNTER — Other Ambulatory Visit: Payer: Self-pay

## 2020-10-21 MED ORDER — CHLORPHENIRAMINE MALEATE 4 MG PO TABS: 4.0000 mg | ORAL_TABLET | Freq: Two times a day (BID) | ORAL | 0 refills | Status: DC | PRN

## 2020-10-21 MED ORDER — TUSSICAPS 5-4 MG PO CP12: 1.0000 | ORAL_CAPSULE | Freq: Two times a day (BID) | ORAL | 0 refills | Status: DC | PRN

## 2020-10-21 MED ORDER — HYDROCODONE-ACETAMINOPHEN 10-325 MG PO TABS: ORAL_TABLET | ORAL | 0 refills | Status: DC

## 2020-10-21 NOTE — Telephone Encounter (Signed)
Call placed to patient to make aware. Left message on VM.

## 2020-10-21 NOTE — Telephone Encounter (Signed)
Received call from pharmacy Tussi caps are not available  Will send in hydrocodone 10-325mg  BID along with separate Chlorpheninramine 4mg  tab BID

## 2020-10-21 NOTE — Telephone Encounter (Signed)
I will call out cough medicine.  I would strongly encourage the infusion to prevent hospitalization.

## 2020-10-21 NOTE — Telephone Encounter (Signed)
Walter [name] has tested positive for covid, he does NOT want an Infusion, reasoning his wife just had one and he does not want to go through it. Walter [name] mentioned he has a cough medication in his chart and if rx could be sent to pharmacy? He can not take liquid, only tablets.

## 2020-10-25 NOTE — Telephone Encounter (Signed)
Patient referred to Infusion Clinic

## 2020-10-28 ENCOUNTER — Telehealth: Payer: Self-pay

## 2020-10-28 ENCOUNTER — Telehealth: Payer: Self-pay | Admitting: Family Medicine

## 2020-10-28 NOTE — Telephone Encounter (Signed)
Agree with referral to infusion clinic.  Otherwise, care is supportive unless he develops SOB which means he needs to go to ER>

## 2020-10-28 NOTE — Telephone Encounter (Signed)
error 

## 2020-10-28 NOTE — Telephone Encounter (Signed)
Yes sir. Will call patient back to make him aware if becoming SOB go immediately to nearest E.R also Infusion Clinic will be in contact within the next 48 hrs as Nurse Gerre Scull informed me.

## 2020-10-28 NOTE — Telephone Encounter (Signed)
Patient tested positive for covid on 10-25-20 Fatigue, weak, cough, No fever as of moment, had fever in the last 24 hr. Dizziness. Did refer patient to infusion clinic.

## 2020-10-29 ENCOUNTER — Encounter: Payer: Self-pay | Admitting: Oncology

## 2020-10-29 ENCOUNTER — Telehealth: Payer: Self-pay | Admitting: Oncology

## 2020-10-29 NOTE — Telephone Encounter (Signed)
Re: [facility]  Called to Discuss with patient about Covid symptoms and the use of regeneron, a monoclonal antibody infusion for those with mild to moderate Covid symptoms and at a high risk of hospitalization.     Pt is qualified for this infusion at the [facility] due to co-morbid conditions and/or a member of an at-risk group.    Past Medical History:  Diagnosis Date  . Gout   . Prediabetes     Specific risk condition-Obesity    Unable to reach pt. Left VM and MCM.  Mignon Pine, AGNP-C [phone] (Infusion Center Hotline)

## 2020-10-31 ENCOUNTER — Telehealth (HOSPITAL_COMMUNITY): Payer: Self-pay | Admitting: Family

## 2020-10-31 NOTE — Telephone Encounter (Signed)
Called to discuss with Orpah Clinton about Covid symptoms and potential candidacy for the use of sotrovimab, a combination monoclonal antibody infusion for those with mild to moderate Covid symptoms and at a high risk of hospitalization.     Pt is qualified for this infusion at the infusion center due to co-morbid conditions and/or a member of an at-risk group, however unable to reach patient. VM left.   Quavis Klutz,NP

## 2020-11-16 ENCOUNTER — Telehealth: Payer: Self-pay | Admitting: Family Medicine

## 2020-11-16 DIAGNOSIS — R059 Cough, unspecified: Secondary | ICD-10-CM

## 2020-11-16 NOTE — Telephone Encounter (Signed)
Pt tested postive last month for Covid can Dr.Pickard place a order to have a chest xray

## 2020-11-16 NOTE — Telephone Encounter (Signed)
Patient reports that he continues to have cough. Advised that cough can linger, but patient is requesting CXR.   Ok to order?

## 2020-11-17 ENCOUNTER — Other Ambulatory Visit: Payer: Self-pay

## 2020-11-17 ENCOUNTER — Encounter: Payer: Self-pay | Admitting: Family Medicine

## 2020-11-17 ENCOUNTER — Ambulatory Visit
Admission: RE | Admit: 2020-11-17 | Discharge: 2020-11-17 | Disposition: A | Discharge status: Home / Self Care | Payer: 59 | Source: Ambulatory Visit | Attending: Family Medicine | Admitting: Family Medicine

## 2020-11-17 ENCOUNTER — Ambulatory Visit (INDEPENDENT_AMBULATORY_CARE_PROVIDER_SITE_OTHER): Payer: 59 | Admitting: Family Medicine

## 2020-11-17 VITALS — BP 110/68 | HR 98 | Temp 99.1°F | Resp 14 | Ht 75.0 in | Wt 344.0 lb

## 2020-11-17 DIAGNOSIS — U071 COVID-19: Secondary | ICD-10-CM

## 2020-11-17 MED ORDER — BUDESONIDE-FORMOTEROL FUMARATE 160-4.5 MCG/ACT IN AERO: 2.0000 | INHALATION_SPRAY | Freq: Two times a day (BID) | RESPIRATORY_TRACT | 3 refills | Status: DC

## 2020-11-17 MED ORDER — PREDNISONE 20 MG PO TABS: 40.0000 mg | ORAL_TABLET | Freq: Every day | ORAL | 0 refills | Status: DC

## 2020-11-17 NOTE — Telephone Encounter (Signed)
Patient in office and made aware.  

## 2020-11-17 NOTE — Progress Notes (Signed)
Subjective:    Patient ID: Walter [name], male    DOB: 1966-01-21, 54 y.o.   MRN: [id number]  HPI  Patient was diagnosed with Covid around November.  He never received monoclonal antibodies.  He was treated symptomatically.  He reports a nonproductive persistent cough now more than 4 weeks later.  Cough is dry.  He reports shortness of breath with activity.  He denies any chest pain.  He denies any pleurisy.  He denies any hemoptysis.  He does occasionally still have subjective fevers about every third night and chills.  He denies any orthopnea or paroxysmal nocturnal dyspnea.  He does have a history of reactive airway disease and has been on albuterol in the past. Past Medical History:  Diagnosis Date  . Gout   . Prediabetes    No past surgical history on file. Current Outpatient Medications on File Prior to Visit  Medication Sig Dispense Refill  . acetaminophen-codeine (TYLENOL #3) 300-30 MG tablet Take 1 tablet by mouth every 8 (eight) hours as needed for moderate pain. 20 tablet 0  . allopurinol (ZYLOPRIM) 300 MG tablet Take 1 tablet (300 mg total) by mouth daily. 30 tablet 11  . chlorpheniramine (CHLOR-TRIMETON) 4 MG tablet Take 1 tablet (4 mg total) by mouth 2 (two) times daily as needed for allergies. 14 tablet 0  . Colchicine (MITIGARE) 0.6 MG CAPS daily 90 capsule 3  . Hydrocod Polst-Chlorphen Polst (TUSSICAPS) 5-4 MG CP12 Take 1 capsule by mouth 2 (two) times daily as needed. 30 capsule 0  . HYDROcodone-acetaminophen (NORCO) 10-325 MG tablet 1 tablet twice a day prn cough 30 tablet 0  . predniSONE (DELTASONE) 10 MG tablet Take 6 tablets day one; take 5 tablets day two; take 4 tablets day three; take 3 tablets day four; take 2 tablets day five; take 1 tablet day six 21 tablet 0   No current facility-administered medications on file prior to visit.   No Known Allergies Social History   Socioeconomic History  . Marital status: Married    Spouse name: Not on file  . Number of  children: Not on file  . Years of education: Not on file  . Highest education level: Not on file  Occupational History  . Not on file  Tobacco Use  . Smoking status: Never Smoker  . Smokeless tobacco: Never Used  Substance and Sexual Activity  . Alcohol use: No  . Drug use: No  . Sexual activity: Not on file  Other Topics Concern  . Not on file  Social History Narrative  . Not on file   Social Determinants of Health   Financial Resource Strain: Not on file  Food Insecurity: Not on file  Transportation Needs: Not on file  Physical Activity: Not on file  Stress: Not on file  Social Connections: Not on file  Intimate Partner Violence: Not on file      Review of Systems  All other systems reviewed and are negative.      Objective:   Physical Exam Vitals reviewed.  HENT:     Nose: Mucosal edema present. No rhinorrhea.     Mouth/Throat:     Pharynx: No oropharyngeal exudate.  Cardiovascular:     Rate and Rhythm: Normal rate and regular rhythm.     Heart sounds: Normal heart sounds.  Pulmonary:     Effort: Pulmonary effort is normal. No respiratory distress.     Breath sounds: Decreased air movement present. Decreased breath sounds and wheezing present.  No rhonchi or rales.  Abdominal:     General: Bowel sounds are normal.     Palpations: Abdomen is soft.  Musculoskeletal:     Cervical back: Neck supple.  Lymphadenopathy:     Cervical: No cervical adenopathy.           Assessment & Plan:  COVID-19 - Plan: budesonide-formoterol (SYMBICORT) 160-4.5 MCG/ACT inhaler, predniSONE (DELTASONE) 20 MG tablet, DG Chest 2 View  Patient recently had COVID-19.  However he appears to be having reactive airway disease or even a COPD exacerbation related to it.  Proceed with a chest x-ray immediately.  Begin Symbicort 2 puffs inhaled twice daily and prednisone 40 mg daily for 7 days.  Reassess in 1 week or sooner if worsening.

## 2020-11-17 NOTE — Telephone Encounter (Signed)
CXR ordered.   Call placed to patient. LMTRC.  

## 2020-11-17 NOTE — Telephone Encounter (Signed)
Ok with cxr.

## 2020-11-24 ENCOUNTER — Encounter: Payer: Self-pay | Admitting: Family Medicine

## 2020-11-24 ENCOUNTER — Other Ambulatory Visit: Payer: Self-pay

## 2020-11-24 ENCOUNTER — Ambulatory Visit (INDEPENDENT_AMBULATORY_CARE_PROVIDER_SITE_OTHER): Payer: 59 | Admitting: Family Medicine

## 2020-11-24 VITALS — BP 136/80 | HR 88 | Temp 98.0°F | Resp 18 | Ht 75.0 in | Wt 352.0 lb

## 2020-11-24 DIAGNOSIS — U071 COVID-19: Secondary | ICD-10-CM | POA: Diagnosis not present

## 2020-11-24 NOTE — Progress Notes (Signed)
Subjective:    Patient ID: Walter [name], male    DOB: 15-Nov-1966, 55 y.o.   MRN: [id number]  HPI  11/17/20 Patient was diagnosed with Covid around November.  He never received monoclonal antibodies.  He was treated symptomatically.  He reports a nonproductive persistent cough now more than 4 weeks later.  Cough is dry.  He reports shortness of breath with activity.  He denies any chest pain.  He denies any pleurisy.  He denies any hemoptysis.  He does occasionally still have subjective fevers about every third night and chills.  He denies any orthopnea or paroxysmal nocturnal dyspnea.  He does have a history of reactive airway disease and has been on albuterol in the past.  At that time, my plan was: Patient recently had COVID-19.  However he appears to be having reactive airway disease or even a COPD exacerbation related to it.  Proceed with a chest x-ray immediately.  Begin Symbicort 2 puffs inhaled twice daily and prednisone 40 mg daily for 7 days.  Reassess in 1 week or sooner if worsening.  11/24/20  FINDINGS: The lungs are clear. Heart size is normal. No pneumothorax or pleural effusion. No acute or focal bony abnormality. Thankfully, the patient seems to be doing much better today and is back to his baseline.  He states that he saw improvement after just 2 days on the prednisone.  He was only able to take 1 dose of the Symbicort and had to stop because it caused him to vomit.  However he states that the cough is almost completely gone.  He denies any fevers chills or shortness of breath.  On exam today, his breath sounds have improved dramatically.  His air movement has improved.  There is no wheezing Past Medical History:  Diagnosis Date  . Gout   . Prediabetes    History reviewed. No pertinent surgical history. Current Outpatient Medications on File Prior to Visit  Medication Sig Dispense Refill  . allopurinol (ZYLOPRIM) 300 MG tablet Take 1 tablet (300 mg total) by mouth daily. 30  tablet 11  . Colchicine (MITIGARE) 0.6 MG CAPS daily 90 capsule 3  . predniSONE (DELTASONE) 20 MG tablet Take 2 tablets (40 mg total) by mouth daily with breakfast. 20 tablet 0  . acetaminophen-codeine (TYLENOL #3) 300-30 MG tablet Take 1 tablet by mouth every 8 (eight) hours as needed for moderate pain. (Patient not taking: Reported on 11/24/2020) 20 tablet 0  . budesonide-formoterol (SYMBICORT) 160-4.5 MCG/ACT inhaler Inhale 2 puffs into the lungs 2 (two) times daily. (Patient not taking: Reported on 11/24/2020) 1 each 3   No current facility-administered medications on file prior to visit.   No Known Allergies Social History   Socioeconomic History  . Marital status: Married    Spouse name: Not on file  . Number of children: Not on file  . Years of education: Not on file  . Highest education level: Not on file  Occupational History  . Not on file  Tobacco Use  . Smoking status: Never Smoker  . Smokeless tobacco: Never Used  Substance and Sexual Activity  . Alcohol use: No  . Drug use: No  . Sexual activity: Not on file  Other Topics Concern  . Not on file  Social History Narrative  . Not on file   Social Determinants of Health   Financial Resource Strain: Not on file  Food Insecurity: Not on file  Transportation Needs: Not on file  Physical Activity: Not  on file  Stress: Not on file  Social Connections: Not on file  Intimate Partner Violence: Not on file      Review of Systems  All other systems reviewed and are negative.      Objective:   Physical Exam Vitals reviewed.  HENT:     Nose: Mucosal edema present. No rhinorrhea.     Mouth/Throat:     Pharynx: No oropharyngeal exudate.  Cardiovascular:     Rate and Rhythm: Normal rate and regular rhythm.     Heart sounds: Normal heart sounds.  Pulmonary:     Effort: Pulmonary effort is normal. No respiratory distress.     Breath sounds: No decreased air movement. No decreased breath sounds, wheezing, rhonchi or  rales.  Abdominal:     General: Bowel sounds are normal.     Palpations: Abdomen is soft.  Musculoskeletal:     Cervical back: Neck supple.  Lymphadenopathy:     Cervical: No cervical adenopathy.           Assessment & Plan:  COVID-19  Patient has symptomatically returned to his baseline.  I believe the patient likely has some underlying reactive airway disease or may be even some mild COPD.  At the present time this is the first time we have ever had to use prednisone to help control his breathing.  Therefore we will not perform formal pulmonary function test.  However I explained to the patient that if he has "reactive airway disease" frequently in the future he may benefit from pulmonary function test to determine if he does have underlying COPD versus reactive airway disease versus asthma

## 2021-06-28 ENCOUNTER — Other Ambulatory Visit: Payer: Self-pay | Admitting: Family Medicine

## 2021-09-21 ENCOUNTER — Encounter: Payer: Self-pay | Admitting: Nurse Practitioner

## 2021-09-21 ENCOUNTER — Other Ambulatory Visit: Payer: Self-pay

## 2021-09-21 ENCOUNTER — Ambulatory Visit (INDEPENDENT_AMBULATORY_CARE_PROVIDER_SITE_OTHER): Payer: Self-pay | Admitting: Nurse Practitioner

## 2021-09-21 DIAGNOSIS — R062 Wheezing: Secondary | ICD-10-CM

## 2021-09-21 DIAGNOSIS — R300 Dysuria: Secondary | ICD-10-CM

## 2021-09-21 MED ORDER — PREDNISONE 20 MG PO TABS: 40.0000 mg | ORAL_TABLET | Freq: Every day | ORAL | 0 refills | Status: AC

## 2021-09-21 MED ORDER — CIPROFLOXACIN HCL 500 MG PO TABS: 500.0000 mg | ORAL_TABLET | Freq: Two times a day (BID) | ORAL | 0 refills | Status: AC

## 2021-09-21 NOTE — Progress Notes (Signed)
Subjective:    Patient ID: Walter [name], male    DOB: [birth year], 55 y.o.   MRN: [id number]  HPI: Walter [name] is a 55 y.o. male presenting virtually for cough and burning with urination.  Chief Complaint  Patient presents with   Urinary Tract Infection   URI   COUGH Duration: 3 months Circumstances of initial development of cough: unknown Cough severity: severe Cough description: dry hacky Aggravating factors: nothing known Alleviating factors: nothing so far Status: stable Treatments attempted: prednisone in the past Wheezing: yes Shortness of breath:  occasionally after cough Chest pain: no Chest tightness:no Nasal congestion: no Runny nose: no Postnasal drip: no Frequent throat clearing or swallowing: no Hemoptysis: no Fevers: no Night sweats: no Weight loss: no Heartburn: no Recent foreign travel: no Tuberculosis contacts: no  Had COVID-19 at the beginning of this year and was given Symbicort to start on for possible reactive airway disease.  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   URINARY SYMPTOMS Duration: 3 weeks ago; reports he gets UTIs "all the time," about 2-3 times per year.  Last one was a few months ago.  He thinks he was given Ciprofloxacin. Dysuria: burning Urinary frequency: yes Urgency: yes Small volume voids: yes Urinary incontinence: no Foul odor: yes Hematuria: no Abdominal pain: no Back pain: no Suprapubic pain/pressure: no Flank pain: no Fever:  no Nausea: no Vomiting: no Status: stable Previous urinary tract infection: yes Recurrent urinary tract infection: no; last was 6-8 months Treatments attempted: increasing fluids  No Known Allergies  Outpatient Encounter Medications as of 09/21/2021  Medication Sig   acetaminophen-codeine (TYLENOL #3) 300-30 MG tablet Take 1 tablet by mouth every 8 (eight) hours as needed for  moderate pain. (Patient not taking: Reported on 11/24/2020)   allopurinol (ZYLOPRIM) 300 MG tablet TAKE 1 TABLET BY MOUTH EVERY DAY   budesonide-formoterol (SYMBICORT) 160-4.5 MCG/ACT inhaler Inhale 2 puffs into the lungs 2 (two) times daily. (Patient not taking: Reported on 11/24/2020)   Colchicine (MITIGARE) 0.6 MG CAPS daily   predniSONE (DELTASONE) 20 MG tablet Take 2 tablets (40 mg total) by mouth daily with breakfast for 5 days.   [DISCONTINUED] predniSONE (DELTASONE) 20 MG tablet Take 2 tablets (40 mg total) by mouth daily with breakfast.   No facility-administered encounter medications on file as of 09/21/2021.    Patient Active Problem List   Diagnosis Date Noted   Prediabetes    Gout     Past Medical History:  Diagnosis Date   Gout    Prediabetes     Relevant past medical, surgical, family and social history reviewed and updated as indicated. Interim medical history since our last visit reviewed.  Review of Systems Per HPI unless specifically indicated above     Objective:    There were no vitals taken for this visit.  Wt Readings from Last 3 Encounters:  11/24/20 (!) 352 lb (159.7 kg)  11/17/20 (!) 344 lb (156 kg)  04/19/20 (!) 367 lb 9.6 oz (166.7 kg)    Physical Exam Physical examination unable to be performed due to lack of equipment.    Assessment & Plan:  1. Wheezing Acute, with cough.  Suspect underlying reactive vs. Obstructive airway disease.  No red flags in history today. Patient reports complete intolerance to all inhalers.  Will start prednisone burst.  I would also like  for the patient to be examined in-person in the next 2-4 weeks for reassessment.  If he worsens, needs to be seen sooner.  We may want to get pulmonary function testing or spirometry in the future.   - predniSONE (DELTASONE) 20 MG tablet; Take 2 tablets (40 mg total) by mouth daily with breakfast for 5 days.  Dispense: 10 tablet; Refill: 0  2. Dysuria Acute.  We do not have any urine  samples or urine microbiology testing on this patient.  I also do not see where we have seen him for this problem in the past.  I have asked him to come and drop off a urine sample at his earliest convenience.  We can empirically treat after we have the urine sample, and I will send Ciprofloxacin 500 mg twice daily for 5 days in for this reason, but we need the urine sample before starting antibiotics and this was explained to the patient.  - Urinalysis, Routine w reflex microscopic; Future - Urine Culture; Future   Follow up plan: Return for 2-4 weeks recheck breathing with PCP.  This visit was completed via telephone due to the restrictions of the COVID-19 pandemic. All issues as above were discussed and addressed but no physical exam was performed. If it was felt that the patient should be evaluated in the office, they were directed there. The patient verbally consented to this visit. Patient was unable to complete an audio/visual visit due to Lack of internet. Location of the patient: parking lot Location of the provider: work Those involved with this call:  Provider: Cathlean Marseilles, DNP, FNP-C CMA: n/a Front Desk/Registration: Percival Spanish  Time spent on call:  011 minutes on the phone discussing health concerns. 25 minutes total spent in review of patient's record and preparation of their chart. I verified patient identity using two factors (patient name and date of birth). Patient consents verbally to being seen via telemedicine visit today.

## 2021-09-22 ENCOUNTER — Other Ambulatory Visit: Payer: Self-pay

## 2021-09-22 DIAGNOSIS — R300 Dysuria: Secondary | ICD-10-CM

## 2021-09-22 LAB — MICROSCOPIC MESSAGE

## 2021-09-22 LAB — URINALYSIS, ROUTINE W REFLEX MICROSCOPIC
Bilirubin Urine: NEGATIVE
Glucose, UA: NEGATIVE
Hgb urine dipstick: NEGATIVE
Hyaline Cast: NONE SEEN /LPF
Ketones, ur: NEGATIVE
Nitrite: NEGATIVE
Protein, ur: NEGATIVE
RBC / HPF: NONE SEEN /HPF (ref 0–2)
Specific Gravity, Urine: 1.025 (ref 1.001–1.035)
Squamous Epithelial / HPF: NONE SEEN /HPF (ref <=5)
pH: 5.5 (ref 5.0–8.0)

## 2021-10-13 LAB — SUSCEPTIBILITY, AEROBIC BACTERIUM
CEFTRIAXONE: 0.5
LEVOFLOXACIN: <=0.5
Linezolid: 1
MEROPENEM: <=0.25
PENICILLIN: <=0.03
TETRACYCLINE: <=1
VANCOMYCIN: <=0.5

## 2021-10-13 LAB — TEST AUTHORIZATION 2

## 2021-10-13 LAB — TEST AUTHORIZATION

## 2021-10-13 LAB — URINE CULTURE
MICRO NUMBER:: 12595706
SPECIMEN QUALITY:: ADEQUATE

## 2021-10-20 ENCOUNTER — Emergency Department (HOSPITAL_COMMUNITY): Payer: Self-pay

## 2021-10-20 ENCOUNTER — Encounter (HOSPITAL_COMMUNITY): Payer: Self-pay | Admitting: Emergency Medicine

## 2021-10-20 ENCOUNTER — Other Ambulatory Visit: Payer: Self-pay

## 2021-10-20 ENCOUNTER — Ambulatory Visit (INDEPENDENT_AMBULATORY_CARE_PROVIDER_SITE_OTHER): Payer: Self-pay | Admitting: Nurse Practitioner

## 2021-10-20 ENCOUNTER — Emergency Department (HOSPITAL_COMMUNITY)
Admission: EM | Admit: 2021-10-20 | Discharge: 2021-10-20 | Disposition: A | Discharge status: Home / Self Care | Payer: Self-pay | Attending: Emergency Medicine | Admitting: Emergency Medicine

## 2021-10-20 ENCOUNTER — Encounter: Payer: Self-pay | Admitting: Nurse Practitioner

## 2021-10-20 VITALS — BP 130/80 | HR 87 | Temp 99.0°F | Ht 75.0 in | Wt 378.2 lb

## 2021-10-20 DIAGNOSIS — I517 Cardiomegaly: Secondary | ICD-10-CM

## 2021-10-20 DIAGNOSIS — R0902 Hypoxemia: Secondary | ICD-10-CM

## 2021-10-20 DIAGNOSIS — J9611 Chronic respiratory failure with hypoxia: Secondary | ICD-10-CM

## 2021-10-20 DIAGNOSIS — R062 Wheezing: Secondary | ICD-10-CM

## 2021-10-20 DIAGNOSIS — J9610 Chronic respiratory failure, unspecified whether with hypoxia or hypercapnia: Secondary | ICD-10-CM | POA: Insufficient documentation

## 2021-10-20 DIAGNOSIS — Z8616 Personal history of COVID-19: Secondary | ICD-10-CM | POA: Insufficient documentation

## 2021-10-20 DIAGNOSIS — R600 Localized edema: Secondary | ICD-10-CM | POA: Insufficient documentation

## 2021-10-20 DIAGNOSIS — Z20822 Contact with and (suspected) exposure to covid-19: Secondary | ICD-10-CM | POA: Insufficient documentation

## 2021-10-20 LAB — CBC WITH DIFFERENTIAL/PLATELET
Abs Immature Granulocytes: 0.11 10*3/uL — ABNORMAL HIGH (ref 0.00–0.07)
Basophils Absolute: 0.1 10*3/uL (ref 0.0–0.1)
Basophils Relative: 1 %
Eosinophils Absolute: 0.2 10*3/uL (ref 0.0–0.5)
Eosinophils Relative: 2 %
HCT: 48.5 % (ref 39.0–52.0)
Hemoglobin: 13.7 g/dL (ref 13.0–17.0)
Immature Granulocytes: 1 %
Lymphocytes Relative: 25 %
Lymphs Abs: 2.1 10*3/uL (ref 0.7–4.0)
MCH: 28.7 pg (ref 26.0–34.0)
MCHC: 28.2 g/dL — ABNORMAL LOW (ref 30.0–36.0)
MCV: 101.5 fL — ABNORMAL HIGH (ref 80.0–100.0)
Monocytes Absolute: 0.6 10*3/uL (ref 0.1–1.0)
Monocytes Relative: 7 %
Neutro Abs: 5.1 10*3/uL (ref 1.7–7.7)
Neutrophils Relative %: 64 %
Platelets: 246 10*3/uL (ref 150–400)
RBC: 4.78 MIL/uL (ref 4.22–5.81)
RDW: 14.4 % (ref 11.5–15.5)
WBC: 8.1 10*3/uL (ref 4.0–10.5)
nRBC: 0 % (ref 0.0–0.2)

## 2021-10-20 LAB — RESP PANEL BY RT-PCR (FLU A&B, COVID) ARPGX2
Influenza A by PCR: NEGATIVE
Influenza B by PCR: NEGATIVE
SARS Coronavirus 2 by RT PCR: NEGATIVE

## 2021-10-20 LAB — BASIC METABOLIC PANEL
Anion gap: 8 (ref 5–15)
BUN: 8 mg/dL (ref 6–20)
CO2: 30 mmol/L (ref 22–32)
Calcium: 8.8 mg/dL — ABNORMAL LOW (ref 8.9–10.3)
Chloride: 102 mmol/L (ref 98–111)
Creatinine, Ser: 0.98 mg/dL (ref 0.61–1.24)
GFR, Estimated: >60 mL/min (ref >60)
Glucose, Bld: 143 mg/dL — ABNORMAL HIGH (ref 70–99)
Potassium: 4.3 mmol/L (ref 3.5–5.1)
Sodium: 140 mmol/L (ref 135–145)

## 2021-10-20 LAB — BRAIN NATRIURETIC PEPTIDE: B Natriuretic Peptide: 32.5 pg/mL (ref 0.0–100.0)

## 2021-10-20 MED ORDER — ALBUTEROL SULFATE (2.5 MG/3ML) 0.083% IN NEBU
2.5000 mg | INHALATION_SOLUTION | Freq: Once | RESPIRATORY_TRACT | Status: AC
Start: 2021-10-20 — End: 2021-10-20
  Administered 2021-10-20: 2.5 mg via RESPIRATORY_TRACT

## 2021-10-20 MED ORDER — PREDNISONE 10 MG (21) PO TBPK: ORAL_TABLET | Freq: Every day | ORAL | 0 refills | Status: DC

## 2021-10-20 MED ORDER — BENZONATATE 100 MG PO CAPS: 100.0000 mg | ORAL_CAPSULE | Freq: Three times a day (TID) | ORAL | 1 refills | Status: DC | PRN

## 2021-10-20 MED ORDER — DOXYCYCLINE HYCLATE 100 MG PO CAPS: 100.0000 mg | ORAL_CAPSULE | Freq: Two times a day (BID) | ORAL | 0 refills | Status: DC

## 2021-10-20 NOTE — ED Notes (Signed)
Patient placed on 2L O2 [place], SpO2 increased from 85% to 93%. Supplemental O2 increased to 3L, SpO2 increased to 95%.

## 2021-10-20 NOTE — Addendum Note (Signed)
Addended by: Cindra Presume D on: 10/20/2021 10:17 AM   Modules accepted: Orders

## 2021-10-20 NOTE — ED Triage Notes (Signed)
Patient sent to ED for evaluation of hypoxia, SpO2 on room air 85%, does not wear oxygen at baseline. Patient alert, oriented, speaking in complete sentences, expiratory wheezing.

## 2021-10-20 NOTE — Discharge Instructions (Signed)
We saw you in the ER for low oxygen.  The x-ray showing slightly enlarged heart.  There is no clear evidence of pneumonia.  The COVID-19, flu test is negative.  Given your chronic cough, we suspect that the low oxygen that you are noticing is because of scarring from the COVID-19 infection you had earlier in the year.  We wanted you to be admitted to the hospital or get CAT scan for further diagnostic clarity, however you have preferred to receive outpatient work-up.  Provided is the phone number for the pulmonary clinics that are part of the [facility] health system.  Call each of him and set up the earliest appointment that works for you.  Return to the ER if you start having worsening chest pain, shortness of breath, dizziness, near fainting. We recommend that you do not exert yourself unnecessarily.  If you notice worsening shortness of breath with exertion, you need to take a break.  As discussed, low oxygenation can put you at risk for heart attacks, strokes.

## 2021-10-20 NOTE — ED Provider Notes (Signed)
Emergency Medicine Provider Triage Evaluation Note  Walter [name] , a 55 y.o. male  was evaluated in triage.  Pt complains of shortness of breath onset 1 month.  Patient ambulated to ED room and noted mild shortness of breath.  Patient was evaluated at his primary care office today and noted to be hypoxic to the mid to high 80s.  Patient was placed on 2 L via nasal cannula and oxygen saturation above 90% achieved.  Patient given a breathing treatment at his primary care office as well.  His primary care office referred him to the ED for work-up of his hypoxia.  He has associated cough.  Patient denies chest pain, abdominal pain, nausea, vomiting, fever, chills.  Denies sick contacts.  He has not tried any medications for symptoms.  Patient is not on oxygen at home at baseline.  Patient denies history of CHF, COPD, asthma.  Patient reports remote history of asthma.  Review of Systems  Positive: Shortness of breath Negative: Chest pain  Physical Exam  BP 140/87 (BP Location: Left Arm)   Pulse 76   Temp 98.9 F (37.2 C) (Oral)   Resp 18   SpO2 94%  Gen:   Awake, no distress   Resp:  Normal effort, decreased breath sounds MSK:   Moves extremities without difficulty   Medical Decision Making  Medically screening exam initiated at 10:38 AM.  Appropriate orders placed.  Walter [name] was informed that the remainder of the evaluation will be completed by another provider, this initial triage assessment does not replace that evaluation, and the importance of remaining in the ED until their evaluation is complete.     Shaunette Gassner A, PA-C 10/20/21 1040    Rozelle Logan, [place] 10/20/21 1646

## 2021-10-20 NOTE — Progress Notes (Signed)
Subjective:    Patient ID: Walter [name], male    DOB: [birth year], 55 y.o.   MRN: [id number]  HPI: Walter [name] is a 55 y.o. male presenting for wheezing follow up.  Chief Complaint  Patient presents with   Wheezing    Follow up Pt states it may be a little better   SHORTNESS OF BREATH Duration: ~ 1 month worsening, ongoing past year since having COVID in Jan 2022 Onset: graduals -  reports worse since COVID in January 2022 Description of breathing discomfort: cant take a deep breath in Severity: moderate Episode duration: constant Frequency: constant Related to exertion: yes; worse with exertion Cough: yes productive and yes non-productive Chest tightness: yes Wheezing: yes Fevers: no Chest pain: no Palpitations: no  Nausea: no Diaphoresis: yes; at night  Deconditioning: yes Status: better with prednisone, then worsened when stopped  Wife reports she checked oxygen at home this week and it was in the 70s.  Patient requested I call boss and wife.  I called and updated them both on speaker phone with him in the room.  No Known Allergies  Outpatient Encounter Medications as of 10/20/2021  Medication Sig   acetaminophen-codeine (TYLENOL #3) 300-30 MG tablet Take 1 tablet by mouth every 8 (eight) hours as needed for moderate pain. (Patient not taking: Reported on 11/24/2020)   allopurinol (ZYLOPRIM) 300 MG tablet TAKE 1 TABLET BY MOUTH EVERY DAY   budesonide-formoterol (SYMBICORT) 160-4.5 MCG/ACT inhaler Inhale 2 puffs into the lungs 2 (two) times daily. (Patient not taking: Reported on 11/24/2020)   Colchicine (MITIGARE) 0.6 MG CAPS daily   No facility-administered encounter medications on file as of 10/20/2021.    Patient Active Problem List   Diagnosis Date Noted   Prediabetes    Gout     Past Medical History:  Diagnosis Date   Gout    Prediabetes     Relevant past medical, surgical, family and social history reviewed and updated as indicated. Interim  medical history since our last visit reviewed.  Review of Systems Per HPI unless specifically indicated above     Objective:    BP 130/80   Pulse 87   Temp 99 F (37.2 C)   Ht 6\' 3"  (1.905 m)   Wt (!) 378 lb 3.2 oz (171.6 kg)   SpO2 (!) 87%   BMI 47.27 kg/m   Wt Readings from Last 3 Encounters:  10/20/21 (!) 378 lb 3.2 oz (171.6 kg)  11/24/20 (!) 352 lb (159.7 kg)  11/17/20 (!) 344 lb (156 kg)    Physical Exam Vitals and nursing note reviewed.  Constitutional:      General: He is not in acute distress.    Appearance: Normal appearance. He is obese. He is ill-appearing. He is not toxic-appearing.  HENT:     Head: Normocephalic and atraumatic.  Cardiovascular:     Rate and Rhythm: Normal rate and regular rhythm.  Pulmonary:     Effort: Pulmonary effort is normal. No respiratory distress.     Breath sounds: Decreased breath sounds and wheezing present.  Skin:    General: Skin is warm and dry.     Capillary Refill: Capillary refill takes less than 2 seconds.     Coloration: Skin is not jaundiced or pale.     Findings: No erythema.  Neurological:     Mental Status: He is alert and oriented to person, place, and time.     Motor: No weakness.  Gait: Gait normal.  Psychiatric:        Mood and Affect: Mood normal.        Behavior: Behavior normal.        Thought Content: Thought content normal.        Judgment: Judgment normal.      Assessment & Plan:  1. Hypoxia Unknown how long this has been a problem - likely months however I have no way of determining that.  We gave an albuterol breathing treatment in office today in hopes we could get his oxygen saturation above 90%.  However, his oxygen continues to drop into the mid to high 80s.  He was placed on 2L by nasal cannula, however our oxygen tank ran out not long after this and he again desaturated into the 80s.  I discussed the plan of care with him and his wife.  I explained to the patient that it is not safe for him  to drive or leave our office in his current state and he needs to go to the Emergency Room.  The patient adamantly declines EMS transportation to an Emergency Room.  His wife reports she will drive him straight from our office to the Emergency room.  At the very least, I believe the patient needs STAT chest x-ray or other lung imaging and may need supplemental oxygen short-term.    I also talked with the patient's boss per the patient's request and with his permission and told him the patient needs to be seen in the Emergency Room and will not be at work today.  2. Wheezing   Follow up plan: Return for after discharge with PCP.

## 2021-10-20 NOTE — ED Notes (Signed)
Pt initially wearing 3L of O2 when in the room. This Clinical research associate took the pt off the 3L [place] and patient maintained room air oxygen at 97%.

## 2021-10-31 NOTE — ED Provider Notes (Signed)
[facility] EMERGENCY DEPARTMENT Provider Note   CSN: [id number] Arrival date & time: 10/20/21  1004     History Chief Complaint  Patient presents with   Shortness of Breath    Walter [name] is a 55 y.o. male.  HPI     55 year old male comes in with chief complaint of shortness of breath.  Patient has history of morbid obesity, gout and is prediabetic.  He had COVID-19 sometime last year.  Patient reports that ever since COVID-19, he has never been himself.  He had gone to a primary care visit today, and they had noted that patient was hypoxic.  He received nebulizer treatment and was sent to the ER.  Patient normally is not on any oxygen.  Patient denies any new wheezing but does indicate some shortness of breath with exertion.  He is also having some orthopnea like symptoms.  Patient states that now when he goes to work, he comes home fully drained.  He is able to fully participate in exertional activities at work.  There is no history of PE, DVT.  Patient denies any chest pain, neck swelling.  Past Medical History:  Diagnosis Date   Gout    Prediabetes     Patient Active Problem List   Diagnosis Date Noted   Prediabetes    Gout     No past surgical history on file.     No family history on file.  Social History   Tobacco Use   Smoking status: Never   Smokeless tobacco: Never  Substance Use Topics   Alcohol use: No   Drug use: No    Home Medications Prior to Admission medications   Medication Sig Start Date End Date Taking? Authorizing Provider  benzonatate (TESSALON PERLES) 100 MG capsule Take 1 capsule (100 mg total) by mouth 3 (three) times daily as needed for cough. 10/20/21  Yes Derwood Kaplan, MD  doxycycline (VIBRAMYCIN) 100 MG capsule Take 1 capsule (100 mg total) by mouth 2 (two) times daily. 10/20/21  Yes Catrice Zuleta, MD  predniSONE (STERAPRED UNI-PAK 21 TAB) 10 MG (21) TBPK tablet Take by mouth daily. Take 6 tabs by  mouth daily  for 2 days, then 5 tabs for 2 days, then 4 tabs for 2 days, then 3 tabs for 2 days, 2 tabs for 2 days, then 1 tab by mouth daily for 2 days 10/20/21  Yes Derwood Kaplan, MD  acetaminophen-codeine (TYLENOL #3) 300-30 MG tablet Take 1 tablet by mouth every 8 (eight) hours as needed for moderate pain. Patient not taking: Reported on 11/24/2020 10/30/18   Cammy Copa, MD  allopurinol (ZYLOPRIM) 300 MG tablet TAKE 1 TABLET BY MOUTH EVERY DAY 06/28/21   Donita Brooks, MD  budesonide-formoterol [facility]) 160-4.5 MCG/ACT inhaler Inhale 2 puffs into the lungs 2 (two) times daily. Patient not taking: Reported on 11/24/2020 11/17/20   Donita Brooks, MD  Colchicine (MITIGARE) 0.6 MG CAPS daily 04/19/20   Elmore Guise, FNP    Allergies    Patient has no known allergies.  Review of Systems   Review of Systems  Constitutional:  Positive for activity change.  Respiratory:  Positive for cough and shortness of breath.   Cardiovascular:  Negative for chest pain.  All other systems reviewed and are negative.  Physical Exam Updated Vital Signs BP 131/73    Pulse 77    Temp 98.9 F (37.2 C) (Oral)    Resp 18    SpO2  94%   Physical Exam Vitals and nursing note reviewed.  Constitutional:      Appearance: He is well-developed.  HENT:     Head: Atraumatic.  Cardiovascular:     Rate and Rhythm: Normal rate.  Pulmonary:     Effort: Pulmonary effort is normal.     Breath sounds: Wheezing present. No decreased breath sounds.  Musculoskeletal:     Cervical back: Neck supple.     Right lower leg: Edema present.     Left lower leg: Edema present.  Skin:    General: Skin is warm.  Neurological:     Mental Status: He is alert and oriented to person, place, and time.    ED Results / Procedures / Treatments   Labs (all labs ordered are listed, but only abnormal results are displayed) Labs Reviewed  BASIC METABOLIC PANEL - Abnormal; Notable for the following components:       Result Value   Glucose, Bld 143 (*)    Calcium 8.8 (*)    All other components within normal limits  CBC WITH DIFFERENTIAL/PLATELET - Abnormal; Notable for the following components:   MCV 101.5 (*)    MCHC 28.2 (*)    Abs Immature Granulocytes 0.11 (*)    All other components within normal limits  RESP PANEL BY RT-PCR (FLU A&B, COVID) ARPGX2  BRAIN NATRIURETIC PEPTIDE    EKG EKG Interpretation  Date/Time:  Friday October 20 2021 10:25:55 EST Ventricular Rate:  74 PR Interval:  168 QRS Duration: 126 QT Interval:  460 QTC Calculation: 510 R Axis:   89 Text Interpretation: Normal sinus rhythm Right bundle branch block Abnormal ECG No old tracing to compare Interpretation limited secondary to artifact Confirmed by Tanda Rockers ([id number]) on 10/23/2021 3:36:12 PM  Radiology No results found.  Procedures Procedures   Medications Ordered in ED Medications - No data to display  ED Course  I have reviewed the triage vital signs and the nursing notes.  Pertinent labs & imaging results that were available during my care of the patient were reviewed by me and considered in my medical decision making (see chart for details).    MDM Rules/Calculators/A&P                          55 year old male comes in with chief complaint of hypoxia.  He has no known underlying lung or cardiac disease.  Had COVID-19 last year.  He reports that his been having chronic cough since COVID and has never felt back to himself.  He has been also feeling more tired after work more recently, however he is able to participate with his work obligations without significant issues.  Patient was hypoxic at arrival.  We took off his oxygen, his O2 sats have mostly been over 90%.  We ambulated him, his O2 sats remained over 90% with that.  However, there have been some drops in his sats while he has been observed in the ED.  Patient's work-up reveals cardiomegaly.  At this time, suspicion is that he might have  new onset CHF.  He also probably has some scarring from his COVID-19 that has led to some chronic hypoxia.  Basic work-up was ordered, BNP was normal. There was no elevated white count.  Indicated to the patient that we would like to proceed with CT scan.  Patient does not have insurance.  He is not wanting to be admitted to the hospital.  Patient prefers outpatient  follow-up and CT scan can be done as an outpatient if needed.  My clinical suspicion for PE is low, especially PE that could cause hypoxia in the setting of BNP being normal.  Patient and the wife are comfortable with strict ER return precautions.  We will give him pulmonary follow-up.  Final Clinical Impression(s) / ED Diagnoses Final diagnoses:  Hypoxemic respiratory failure, chronic (HCC)  Enlarged heart    Rx / DC Orders ED Discharge Orders          Ordered    predniSONE (STERAPRED UNI-PAK 21 TAB) 10 MG (21) TBPK tablet  Daily        10/20/21 2226    doxycycline (VIBRAMYCIN) 100 MG capsule  2 times daily        10/20/21 2226    benzonatate (TESSALON PERLES) 100 MG capsule  3 times daily PRN        10/20/21 2226             Derwood Kaplan, MD 10/31/21 2021

## 2022-01-15 ENCOUNTER — Encounter: Payer: Self-pay | Admitting: Family Medicine

## 2022-02-12 ENCOUNTER — Other Ambulatory Visit: Payer: Self-pay

## 2022-02-12 ENCOUNTER — Ambulatory Visit
Admission: EM | Admit: 2022-02-12 | Discharge: 2022-02-12 | Disposition: A | Discharge status: Home / Self Care | Payer: No Typology Code available for payment source

## 2022-02-12 ENCOUNTER — Encounter (HOSPITAL_COMMUNITY): Payer: Self-pay

## 2022-02-12 ENCOUNTER — Emergency Department (HOSPITAL_COMMUNITY)
Admission: EM | Admit: 2022-02-12 | Discharge: 2022-02-13 | Disposition: A | Discharge status: Home / Self Care | Payer: No Typology Code available for payment source | Attending: Emergency Medicine | Admitting: Emergency Medicine

## 2022-02-12 DIAGNOSIS — R21 Rash and other nonspecific skin eruption: Secondary | ICD-10-CM

## 2022-02-12 DIAGNOSIS — I776 Arteritis, unspecified: Secondary | ICD-10-CM | POA: Diagnosis not present

## 2022-02-12 DIAGNOSIS — R509 Fever, unspecified: Secondary | ICD-10-CM

## 2022-02-12 LAB — CBC WITH DIFFERENTIAL/PLATELET
Abs Immature Granulocytes: 0.11 10*3/uL — ABNORMAL HIGH (ref 0.00–0.07)
Basophils Absolute: 0.1 10*3/uL (ref 0.0–0.1)
Basophils Relative: 1 %
Eosinophils Absolute: 0.1 10*3/uL (ref 0.0–0.5)
Eosinophils Relative: 1 %
HCT: 51.2 % (ref 39.0–52.0)
Hemoglobin: 14.8 g/dL (ref 13.0–17.0)
Immature Granulocytes: 1 %
Lymphocytes Relative: 20 %
Lymphs Abs: 2.1 10*3/uL (ref 0.7–4.0)
MCH: 29.1 pg (ref 26.0–34.0)
MCHC: 28.9 g/dL — ABNORMAL LOW (ref 30.0–36.0)
MCV: 100.6 fL — ABNORMAL HIGH (ref 80.0–100.0)
Monocytes Absolute: 0.8 10*3/uL (ref 0.1–1.0)
Monocytes Relative: 8 %
Neutro Abs: 7.7 10*3/uL (ref 1.7–7.7)
Neutrophils Relative %: 69 %
Platelets: 255 10*3/uL (ref 150–400)
RBC: 5.09 MIL/uL (ref 4.22–5.81)
RDW: 14.9 % (ref 11.5–15.5)
WBC: 10.9 10*3/uL — ABNORMAL HIGH (ref 4.0–10.5)
nRBC: 0 % (ref 0.0–0.2)

## 2022-02-12 LAB — COMPREHENSIVE METABOLIC PANEL
ALT: 12 U/L (ref 0–44)
AST: 17 U/L (ref 15–41)
Albumin: 3.7 g/dL (ref 3.5–5.0)
Alkaline Phosphatase: 65 U/L (ref 38–126)
Anion gap: 7 (ref 5–15)
BUN: 18 mg/dL (ref 6–20)
CO2: 32 mmol/L (ref 22–32)
Calcium: 9 mg/dL (ref 8.9–10.3)
Chloride: 101 mmol/L (ref 98–111)
Creatinine, Ser: 1.06 mg/dL (ref 0.61–1.24)
GFR, Estimated: >60 mL/min (ref >60)
Glucose, Bld: 99 mg/dL (ref 70–99)
Potassium: 4.3 mmol/L (ref 3.5–5.1)
Sodium: 140 mmol/L (ref 135–145)
Total Bilirubin: 0.6 mg/dL (ref 0.3–1.2)
Total Protein: 7.7 g/dL (ref 6.5–8.1)

## 2022-02-12 NOTE — ED Notes (Signed)
Pt denies pain or itching at this time.  ?

## 2022-02-12 NOTE — ED Provider Notes (Signed)
?[facility] ? ? ?MRN: [id number] DOB: 1965-11-30 ? ?Subjective:  ? ?Walter [name] is a 56 y.o. male presenting for 1 week history of progressively worsening rash over the legs, arms and back.  Rash did start over the back but has spread significantly to the legs and is having dramatic swelling of the lower legs as well.  Has felt feverish.  No chest pain, shortness of breath, nausea, vomiting, abdominal pain, hematuria. Denies eating any new foods, starting new medications, exposure to poisonous plants, new hygiene products, new cleaning products or detergents.  ? ?No current facility-administered medications for this encounter. ? ?Current Outpatient Medications:  ?  acetaminophen-codeine (TYLENOL #3) 300-30 MG tablet, Take 1 tablet by mouth every 8 (eight) hours as needed for moderate pain. (Patient not taking: Reported on 11/24/2020), Disp: 20 tablet, Rfl: 0 ?  allopurinol (ZYLOPRIM) 300 MG tablet, TAKE 1 TABLET BY MOUTH EVERY DAY, Disp: 90 tablet, Rfl: 3 ?  benzonatate (TESSALON PERLES) 100 MG capsule, Take 1 capsule (100 mg total) by mouth 3 (three) times daily as needed for cough., Disp: 30 capsule, Rfl: 1 ?  budesonide-formoterol (SYMBICORT) 160-4.5 MCG/ACT inhaler, Inhale 2 puffs into the lungs 2 (two) times daily. (Patient not taking: Reported on 11/24/2020), Disp: 1 each, Rfl: 3 ?  Colchicine (MITIGARE) 0.6 MG CAPS, daily, Disp: 90 capsule, Rfl: 3 ?  doxycycline (VIBRAMYCIN) 100 MG capsule, Take 1 capsule (100 mg total) by mouth 2 (two) times daily., Disp: 14 capsule, Rfl: 0 ?  predniSONE (STERAPRED UNI-PAK 21 TAB) 10 MG (21) TBPK tablet, Take by mouth daily. Take 6 tabs by mouth daily  for 2 days, then 5 tabs for 2 days, then 4 tabs for 2 days, then 3 tabs for 2 days, 2 tabs for 2 days, then 1 tab by mouth daily for 2 days, Disp: 42 tablet, Rfl: 0  ? ?No Known Allergies ? ?Past Medical History:  ?Diagnosis Date  ? Gout   ? Prediabetes   ?  ? ?History reviewed. No pertinent surgical  history. ? ?History reviewed. No pertinent family history. ? ?Social History  ? ?Tobacco Use  ? Smoking status: Never  ? Smokeless tobacco: Never  ?Substance Use Topics  ? Alcohol use: No  ? Drug use: No  ? ? ?ROS ? ? ?Objective:  ? ?Vitals: ?BP 112/74 (BP Location: Right Arm)   Pulse 88   Temp 99.5 ?F (37.5 ?C) (Oral)   Resp 20   SpO2 94%  ? ?Physical Exam ?Constitutional:   ?   General: He is not in acute distress. ?   Appearance: Normal appearance. He is well-developed. He is not ill-appearing, toxic-appearing or diaphoretic.  ?HENT:  ?   Head: Normocephalic and atraumatic.  ?   Right Ear: External ear normal.  ?   Left Ear: External ear normal.  ?   Nose: Nose normal.  ?   Mouth/Throat:  ?   Mouth: Mucous membranes are moist.  ?Eyes:  ?   General: No scleral icterus.    ?   Right eye: No discharge.     ?   Left eye: No discharge.  ?   Extraocular Movements: Extraocular movements intact.  ?Cardiovascular:  ?   Rate and Rhythm: Normal rate and regular rhythm.  ?   Heart sounds: Normal heart sounds. No murmur heard. ?  No friction rub. No gallop.  ?Pulmonary:  ?   Effort: Pulmonary effort is normal. No respiratory distress.  ?   Breath sounds:  Normal breath sounds. No stridor. No wheezing, rhonchi or rales.  ?Skin: ?   Findings: Rash (Violaceous lesions coalescing into large patches worse over the lower legs) present.  ? ?    ?Neurological:  ?   Mental Status: He is alert and oriented to person, place, and time.  ?Psychiatric:     ?   Mood and Affect: Mood normal.     ?   Behavior: Behavior normal.     ?   Thought Content: Thought content normal.  ? ? ? ? ? ? ? ? ? ? ? ?Assessment and Plan :  ? ?PDMP not reviewed this encounter. ? ?1. Vasculitis (HCC)   ?2. Rash and nonspecific skin eruption   ?3. Fever, unspecified   ? ?Patient is in need of a higher level of care than we can provide in the urgent care setting.  I am concerned for vasculitis versus ITP versus other infectious process.  Recommended he present  to [facility] hospital now for further evaluation and intervention.  Patient is hemodynamically stable, can present to the hospital by personal vehicle. ?  ?Wallis BambergMani, Mavric Cortright, PA-C ?02/12/22 1546 ? ?

## 2022-02-12 NOTE — ED Triage Notes (Signed)
Patient states purpura, petechiae rash on bilateral legs, bilateral arms and on back. Patient thinks he may have gotten bit by a spider or insect. Patient has weeping rash to bilateral posterior lower legs.  ?

## 2022-02-12 NOTE — ED Notes (Signed)
Possible vasculitis  ?

## 2022-02-12 NOTE — ED Triage Notes (Signed)
Pt reports rash in legs, arms and back arms x 1 week. States the rash started in the back of the legs and is spreading. Pt think he may was bit by something.  Denies new medications, food detergents, soaps, lotions.  ?

## 2022-02-12 NOTE — Discharge Instructions (Addendum)
Please report to the hospital now as you are in need of higher level of evaluation and intervention than we can provide.  I am concerned that you are having a worsening vasculitis that he needs more testing to provide definitive treatment.  Please had to the hospital now. ?

## 2022-02-13 LAB — SEDIMENTATION RATE: Sed Rate: 25 mm/hr — ABNORMAL HIGH (ref 0–16)

## 2022-02-13 LAB — C-REACTIVE PROTEIN: CRP: 7.7 mg/dL — ABNORMAL HIGH (ref <1.0)

## 2022-02-13 MED ORDER — DOXYCYCLINE HYCLATE 100 MG PO TABS
100.0000 mg | ORAL_TABLET | Freq: Once | ORAL | Status: AC
  Administered 2022-02-13: 100 mg via ORAL
  Filled 2022-02-13: qty 1

## 2022-02-13 MED ORDER — DOXYCYCLINE HYCLATE 100 MG PO CAPS: 100.0000 mg | ORAL_CAPSULE | Freq: Two times a day (BID) | ORAL | 0 refills | Status: DC

## 2022-02-13 NOTE — ED Provider Notes (Signed)
?[place] EMERGENCY DEPARTMENT ?Provider Note ? ? ?CSN: [id number] ?Arrival date & time: 02/12/22  1624 ? ?  ? ?History ? ?Chief Complaint  ?Patient presents with  ? Rash  ? ? ?Walter [name] is a 56 y.o. male. ? ?The history is provided by the patient and the spouse.  ?Rash ?Severity:  Moderate ?Onset quality:  Gradual ?Duration:  1 week ?Timing:  Constant ?Progression:  Worsening ?Chronicity:  New ?Relieved by:  Nothing ?Worsened by:  Nothing ?Associated symptoms: no abdominal pain, no fatigue, no fever, no headaches, no joint pain, no shortness of breath, no throat swelling, no tongue swelling and not vomiting   ?Patient presents from the urgent care.  He reports he has had a rash that started in his right leg that spread to his left leg, is now throughout his body. ?Unknown cause.  His only daily medication is allopurinol.  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  No known fevers.  No joint swelling or pain but does report leg swelling. ? ?No new meds.  No known allergies or new foods ?Home Medications ?Prior to Admission medications   ?Medication Sig Start Date End Date Taking? Authorizing Provider  ?doxycycline (VIBRAMYCIN) 100 MG capsule Take 1 capsule (100 mg total) by mouth 2 (two) times daily. One po bid x 7 days 02/13/22  Yes Zadie Rhine, MD  ?   ? ?Allergies    ?Patient has no known allergies.   ? ?Review of Systems   ?Review of Systems  ?Constitutional:  Negative for fatigue and fever.  ?HENT:  Negative for trouble swallowing.   ?     Denies any oral lesions  ?Eyes:  Negative for visual disturbance.  ?Respiratory:  Positive for cough. Negative for shortness of breath.   ?     Chronic cough that is unchanged  ?Cardiovascular:  Positive for leg swelling. Negative for chest pain.  ?Gastrointestinal:  Negative for abdominal pain and vomiting.  ?Musculoskeletal:  Negative for arthralgias and joint  swelling.  ?Skin:  Positive for rash.  ?Neurological:  Negative for headaches.  ? ?Physical Exam ?Updated Vital Signs ?BP (!) 125/98 (BP Location: Left Arm)   Pulse 72   Temp 98 ?F (36.7 ?C) (Oral)   Resp 20   Ht 1.905 m ( )   Wt (!) 149.7 kg   SpO2 97%   BMI 41.25 kg/m?  ?Physical Exam ?CONSTITUTIONAL: Well developed/well nourished ?HEAD: Normocephalic/atraumatic ?EYES: EOMI/PERRL, no conjunctival lesion ?ENMT: Mucous membranes moist, no oral lesions ?NECK: supple no meningeal signs ?SPINE/BACK:entire spine nontender ?CV: S1/S2 noted, no murmurs/rubs/gallops noted ?LUNGS: Lungs are clear to auscultation bilaterally, no apparent distress ?ABDOMEN: soft, nontender, no rebound or guarding, bowel sounds noted throughout abdomen, obese ?GU:no cva tenderness ?NEURO: Pt is awake/alert/appropriate, moves all extremitiesx4.  No facial droop.   ?EXTREMITIES: pulses normal/equal, full ROM, see photo below, no obvious joint effusions.  Peripheral edema is noted.  No crepitus to the extremities. ?SKIN: warm, see photo below, rash noted to extremities and torso ?PSYCH: no abnormalities of mood noted, alert and oriented to situation ?Photos from urgent care visit also reviewed ? ? ?Patient gave verbal permission to utilize photo for medical documentation only ?The image was not stored on any personal device ?ED Results / Procedures / Treatments   ?Labs ?(all labs ordered are listed, but only abnormal results are displayed) ?Labs Reviewed  ?  CBC WITH DIFFERENTIAL/PLATELET - Abnormal; Notable for the following components:  ?    Result Value  ? WBC 10.9 (*)   ? MCV 100.6 (*)   ? MCHC 28.9 (*)   ? Abs Immature Granulocytes 0.11 (*)   ? All other components within normal limits  ?SEDIMENTATION RATE - Abnormal; Notable for the following components:  ? Sed Rate 25 (*)   ? All other components within normal limits  ?CULTURE, BLOOD (ROUTINE X 2)  ?CULTURE, BLOOD (ROUTINE X 2)  ?COMPREHENSIVE METABOLIC PANEL  ?C-REACTIVE PROTEIN   ?ANTINUCLEAR ANTIBODIES, IFA  ?ROCKY MTN SPOTTED FVR ABS PNL(IGG+IGM)  ? ? ?EKG ?None ? ?Radiology ?No results found. ? ?Procedures ?Procedures  ? ? ?Medications Ordered in ED ?Medications  ?doxycycline (VIBRA-TABS) tablet 100 mg (100 mg Oral Given 02/13/22 0009)  ? ? ?ED Course/ Medical Decision Making/ A&P ?Clinical Course as of 02/13/22 0152  ?Tue Feb 13, 2022  ?0002 I had a long discussion with patient and his wife.  Patient adamantly declines admission.  I advised him I cannot rule out a worsening condition which includes death and disability, patient accepts risk [DW]  ?0002 I discussed risk of death/disability of leaving against medical advice and the patient accepts these risks.  The patient is awake/alert able to make decisions, and does not appear intoxicated ?Patient discharged against medical advice.  ? [DW]  ?  ?Clinical Course User Index ?[DW] Zadie Rhine, MD  ? ?                        ?Medical Decision Making ?Amount and/or Complexity of Data Reviewed ?Labs: ordered. ? ?Risk ?Prescription drug management. ? ? ?Patient presents with rash that appears to be vasculitic in nature. ?Unclear etiology.  Overall he is hemodynamically appropriate and in no acute distress.  The differential diagnosis is large and will require significant amount of evaluation.  Initial labs were unrevealing.  I advised patient to be admitted to expedite the work-up and treatment but he flatly refuses admission.  We discussed this at length with patient and his wife and he still refuses. ?Plan will be to stop allopurinol immediately.  Additional labs were ordered to expedite the work-up.  I have placed patient on doxycycline due to the localized wounds that could be secondarily infected.  RMSF ab panel has been sent though suspicion for tickborne illness is low ?We discussed strict ER return precautions.  Patient signed out AMA ?Message has been sent to his PCP to help expedite outpatient management ? ? ? ? ? ? ?Final  Clinical Impression(s) / ED Diagnoses ?Final diagnoses:  ?Vasculitis (HCC)  ? ? ?Rx / DC Orders ?ED Discharge Orders   ? ?      Ordered  ?  doxycycline (VIBRAMYCIN) 100 MG capsule  2 times daily       ? 02/13/22 0003  ? ?  ?  ? ?  ? ? ?  ?Zadie Rhine, MD ?02/13/22 0157 ? ?

## 2022-02-13 NOTE — Discharge Instructions (Signed)
Please stop allopurinol. ?You can take the antibiotic twice a day for a week and be sure to keep the wounds clean and dry ?Please follow-up with Dr. Tanya Nones this week for close follow-up. ?

## 2022-02-14 LAB — ROCKY MTN SPOTTED FVR ABS PNL(IGG+IGM)
RMSF IgG: NEGATIVE
RMSF IgM: 0.36 index (ref 0.00–0.89)

## 2022-02-14 LAB — BLOOD CULTURE ID PANEL (REFLEXED) - BCID2

## 2022-02-14 LAB — ANTINUCLEAR ANTIBODIES, IFA: ANA Ab, IFA: NEGATIVE

## 2022-02-14 NOTE — ED Notes (Signed)
Phone call from [facility]: [facility] 1 bottle positive for gram positive cocci. [facility], asked to contact client to evaluate current health status. Phone call made to wife, states patient is not having a fever and voices no complaints.  ?

## 2022-02-15 LAB — CULTURE, BLOOD (ROUTINE X 2): Special Requests: ADEQUATE

## 2022-02-16 ENCOUNTER — Ambulatory Visit (INDEPENDENT_AMBULATORY_CARE_PROVIDER_SITE_OTHER): Payer: No Typology Code available for payment source | Admitting: Family Medicine

## 2022-02-16 VITALS — BP 120/60 | HR 79 | Temp 96.9°F | Ht 75.0 in | Wt 374.1 lb

## 2022-02-16 DIAGNOSIS — I776 Arteritis, unspecified: Secondary | ICD-10-CM | POA: Diagnosis not present

## 2022-02-16 NOTE — Progress Notes (Signed)
? ?Subjective:  ? ? Patient ID: Walter ClintonRalph T Mccollister, male    DOB: 08/18/1966, 56 y.o.   MRN: [id number] ? ?HPI ?Patient has a history of gout and is on allopurinol.  He went to the emergency room on March 27 due to a rash that has been going on for approximately 2 weeks.  The rash is characterized by small purpura all over his legs and arms.  Please see the photograph taken in his emergency room visit.  There are numerous, too numerous to count, palpable peripheral.  Each is approximately 2 to 3 mm in diameter.  He also has skin breakdown with venous stasis ulcers like changes on skin overlying his Achilles tendon bilaterally.  He reports +2 pitting edema in both feet and both legs.  He denies any hemoptysis.  He denies any hematuria.  He denies any fevers or chills.  The emergency room physician check a CBC with mildly elevated white blood cell count, and elevated CRP greater than 7, and an elevated sed rate greater than 25.  A CMP was normal.  Lupus test/ANA was normal.  [facility].  We will start the patient on doxycycline he was discharged from the hospital.  He presents today primarily complaining of a rash and swelling in his legs.  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 ?Past Medical History:  ?Diagnosis Date  ? Gout   ? Prediabetes   ? ?No past surgical history on file. ?Current Outpatient Medications on File Prior to Visit  ?Medication Sig Dispense Refill  ? doxycycline (VIBRAMYCIN) 100 MG capsule Take 1 capsule (100 mg total) by mouth 2 (two) times daily. One po bid x 7 days (Patient not taking: Reported on 02/16/2022) 14 capsule 0  ? predniSONE (STERAPRED UNI-PAK 48 TAB) 10 MG (48) TBPK tablet Take by mouth as directed. (Patient not taking: Reported on 02/16/2022)    ? ?No current facility-administered medications on file prior to visit.  ? ?No Known Allergies ?Social History  ? ?Socioeconomic History  ? Marital status: Married  ?  Spouse name: Not  on file  ? Number of children: Not on file  ? Years of education: Not on file  ? Highest education level: Not on file  ?Occupational History  ? Not on file  ?Tobacco Use  ? Smoking status: Never  ? Smokeless tobacco: Never  ?Substance and Sexual Activity  ? Alcohol use: No  ? Drug use: No  ? Sexual activity: Yes  ?Other Topics Concern  ? Not on file  ?Social History Narrative  ? Not on file  ? ?Social Determinants of Health  ? ?Financial Resource Strain: Not on file  ?Food Insecurity: Not on file  ?Transportation Needs: Not on file  ?Physical Activity: Not on file  ?Stress: Not on file  ?Social Connections: Not on file  ?Intimate Partner Violence: Not on file  ? ? ? ? ?Review of Systems  ?Constitutional:  Positive for fatigue.  ?HENT:  Negative for nosebleeds.   ?Respiratory:  Negative for cough, shortness of breath and wheezing.   ?Cardiovascular:  Positive for leg swelling. Negative for chest pain.  ?Gastrointestinal:  Negative for abdominal pain.  ?Genitourinary:  Negative for dysuria and hematuria.  ?Skin:  Positive for rash.  ? ?   ?Objective:  ? Physical Exam ?Constitutional:   ?   General: He is not in acute distress. ?   Appearance: Normal appearance. He is obese. He  is not ill-appearing or toxic-appearing.  ?Cardiovascular:  ?   Rate and Rhythm: Normal rate and regular rhythm.  ?   Pulses: Normal pulses.  ?   Heart sounds: Normal heart sounds.  ?Pulmonary:  ?   Effort: Pulmonary effort is normal.  ?   Breath sounds: Normal breath sounds.  ?Musculoskeletal:  ?   Right lower leg: Edema present.  ?   Left lower leg: Edema present.  ?Skin: ?   Findings: Rash present. Rash is macular and purpuric.  ?Neurological:  ?   Mental Status: He is alert.  ? ? ? ? ? ?   ?Assessment & Plan:  ?Vasculitis (HCC) - Plan: Urinalysis, Routine w reflex microscopic, ANCA screen with reflex titer, Rheumatoid factor, Hepatitis panel, acute, CBC with Differential/Platelet, COMPLETE METABOLIC PANEL WITH GFR, Sedimentation rate,  C-reactive protein, Protein / Creatinine Ratio, Urine, Cryoglobulin Screen w/ Rflx, CANCELED: Cryoglobulin ?At this point, the patient has a vasculitis with collagen to be determined.  Begin by checking a urinalysis evaluating for proteinuria or hematuria.  I will also check a urine protein to creatinine ratio to get an estimate of the severity of the proteinuria.  Check an ANCA to evaluate for the possibility of Wegener's disease versus Goodpasture's.  Check a rheumatoid factor.  Check a hepatitis panel to evaluate for hepatitis B or C.  Check a CBC to evaluate for evidence of hemolysis or thrombocytopenia.  Check a sedimentation rate and a C-reactive protein to see if this is trending downward after holding allopurinol.  Check a cryoglobulin screen.  Await the results of the blood test.  If patient develops gross hematuria, shortness of breath, chest pain, hemoptysis, he is directed to go immediately to the hospital.  At the present time the vasculitis seems to be limited to the skin. ? ?

## 2022-02-17 LAB — URINALYSIS, ROUTINE W REFLEX MICROSCOPIC
Bacteria, UA: NONE SEEN /HPF
Bilirubin Urine: NEGATIVE
Glucose, UA: NEGATIVE
Hgb urine dipstick: NEGATIVE
Hyaline Cast: NONE SEEN /LPF
Ketones, ur: NEGATIVE
Nitrite: NEGATIVE
Specific Gravity, Urine: 1.019 (ref 1.001–1.035)
pH: 6 (ref 5.0–8.0)

## 2022-02-17 LAB — PROTEIN / CREATININE RATIO, URINE
Creatinine, Urine: 141 mg/dL (ref 20–320)
Protein/Creat Ratio: 128 mg/g creat (ref 25–148)
Protein/Creatinine Ratio: 0.128 mg/mg creat (ref 0.025–0.148)
Total Protein, Urine: 18 mg/dL (ref 5–25)

## 2022-02-17 LAB — MICROSCOPIC MESSAGE

## 2022-02-17 LAB — CULTURE, BLOOD (ROUTINE X 2)
Culture: NO GROWTH
Special Requests: ADEQUATE

## 2022-02-19 ENCOUNTER — Other Ambulatory Visit: Payer: Self-pay | Admitting: Family Medicine

## 2022-02-19 DIAGNOSIS — I776 Arteritis, unspecified: Secondary | ICD-10-CM

## 2022-02-19 LAB — COMPLETE METABOLIC PANEL WITH GFR
AG Ratio: 1.2 (calc) (ref 1.0–2.5)
ALT: 7 U/L — ABNORMAL LOW (ref 9–46)
AST: 12 U/L (ref 10–35)
Albumin: 4 g/dL (ref 3.6–5.1)
Alkaline phosphatase (APISO): 71 U/L (ref 35–144)
BUN: 15 mg/dL (ref 7–25)
CO2: 28 mmol/L (ref 20–32)
Calcium: 9.7 mg/dL (ref 8.6–10.3)
Chloride: 102 mmol/L (ref 98–110)
Creat: 0.98 mg/dL (ref 0.70–1.30)
Globulin: 3.3 g/dL (calc) (ref 1.9–3.7)
Glucose, Bld: 96 mg/dL (ref 65–99)
Potassium: 4.6 mmol/L (ref 3.5–5.3)
Sodium: 140 mmol/L (ref 135–146)
Total Bilirubin: 0.5 mg/dL (ref 0.2–1.2)
Total Protein: 7.3 g/dL (ref 6.1–8.1)
eGFR: 90 mL/min/{1.73_m2} (ref >=60)

## 2022-02-19 LAB — HEPATITIS PANEL, ACUTE
Hep A IgM: NONREACTIVE
Hep B C IgM: NONREACTIVE
Hepatitis B Surface Ag: NONREACTIVE
Hepatitis C Ab: NONREACTIVE
SIGNAL TO CUT-OFF: 0.06 (ref <1.00)

## 2022-02-19 LAB — CBC WITH DIFFERENTIAL/PLATELET
Absolute Monocytes: 597 cells/uL (ref 200–950)
Basophils Absolute: 93 cells/uL (ref 0–200)
Basophils Relative: 0.9 %
Eosinophils Absolute: 144 cells/uL (ref 15–500)
Eosinophils Relative: 1.4 %
HCT: 47.2 % (ref 38.5–50.0)
Hemoglobin: 15 g/dL (ref 13.2–17.1)
Lymphs Abs: 2153 cells/uL (ref 850–3900)
MCH: 29 pg (ref 27.0–33.0)
MCHC: 31.8 g/dL — ABNORMAL LOW (ref 32.0–36.0)
MCV: 91.3 fL (ref 80.0–100.0)
MPV: 10.2 fL (ref 7.5–12.5)
Monocytes Relative: 5.8 %
Neutro Abs: 7313 cells/uL (ref 1500–7800)
Neutrophils Relative %: 71 %
Platelets: 298 10*3/uL (ref 140–400)
RBC: 5.17 10*6/uL (ref 4.20–5.80)
RDW: 13.6 % (ref 11.0–15.0)
Total Lymphocyte: 20.9 %
WBC: 10.3 10*3/uL (ref 3.8–10.8)

## 2022-02-19 LAB — RHEUMATOID FACTOR: Rheumatoid fact SerPl-aCnc: 18 IU/mL — ABNORMAL HIGH (ref <14)

## 2022-02-19 LAB — C-REACTIVE PROTEIN: CRP: 63.6 mg/L — ABNORMAL HIGH (ref <8.0)

## 2022-02-19 LAB — SEDIMENTATION RATE: Sed Rate: 41 mm/h — ABNORMAL HIGH (ref 0–20)

## 2022-02-20 LAB — CRYOGLOBULIN SCREEN W/ RFLX

## 2022-02-23 LAB — ANCA SCREEN W REFLEX TITER: ANCA SCREEN: NEGATIVE

## 2022-02-26 ENCOUNTER — Telehealth: Payer: Self-pay | Admitting: Family Medicine

## 2022-02-26 ENCOUNTER — Other Ambulatory Visit: Payer: Self-pay | Admitting: Family Medicine

## 2022-02-26 DIAGNOSIS — I776 Arteritis, unspecified: Secondary | ICD-10-CM

## 2022-02-26 NOTE — Telephone Encounter (Signed)
Patient called to follow up on recent labs; requesting call back with results. ? ?Patient also following up on recent visit; requesting for provider to call in an Rx for ongoing pain. ? ?Pharmacy confirmed as: ? ?[id number] [place], [place] - [address] [facility] [address]  ?[address] [place], [place] [address]  ?Phone:  [phone]  Fax:  [phone]  ? ?Please advise at [phone]. ?

## 2022-02-27 ENCOUNTER — Other Ambulatory Visit: Payer: Self-pay | Admitting: Family Medicine

## 2022-02-27 MED ORDER — FUROSEMIDE 40 MG PO TABS: 40.0000 mg | ORAL_TABLET | Freq: Every day | ORAL | 3 refills | Status: DC

## 2022-02-27 MED ORDER — HYDROCODONE-ACETAMINOPHEN 5-325 MG PO TABS: 1.0000 | ORAL_TABLET | Freq: Four times a day (QID) | ORAL | 0 refills | Status: DC | PRN

## 2022-02-27 NOTE — Telephone Encounter (Signed)
Spoke with patient and reviewed lab results. Patient voiced understanding.  ? ?Patient asking for pain medication and also for BLE edema. Patient scheduled for procedure 03/02/22 for skin biopsy. ? ?Please advise on rx request, thanks! ?

## 2022-02-27 NOTE — Telephone Encounter (Signed)
Patient advised. Nothing further needed at this time.  ? ?

## 2022-03-02 ENCOUNTER — Ambulatory Visit: Payer: No Typology Code available for payment source | Admitting: Family Medicine

## 2022-03-02 VITALS — BP 118/82 | HR 85 | Temp 97.0°F | Ht 75.0 in | Wt 374.0 lb

## 2022-03-02 DIAGNOSIS — I776 Arteritis, unspecified: Secondary | ICD-10-CM

## 2022-03-02 MED ORDER — PREDNISONE 20 MG PO TABS: 60.0000 mg | ORAL_TABLET | Freq: Every day | ORAL | 0 refills | Status: DC

## 2022-03-02 NOTE — Progress Notes (Addendum)
? ?Subjective:  ? ? Patient ID: Walter [name], male    DOB: May 30, 1966, 56 y.o.   MRN: [id number] ? ?HPI ?02/16/22 ?Patient has a history of gout and is on allopurinol.  He went to the emergency room on March 27 due to a rash that has been going on for approximately 2 weeks.  The rash is characterized by small purpura all over his legs and arms.  Please see the photograph taken in his emergency room visit.  There are numerous, too numerous to count, palpable peripheral.  Each is approximately 2 to 3 mm in diameter.  He also has skin breakdown with venous stasis ulcers like changes on skin overlying his Achilles tendon bilaterally.  He reports +2 pitting edema in both feet and both legs.  He denies any hemoptysis.  He denies any hematuria.  He denies any fevers or chills.  The emergency room physician check a CBC with mildly elevated white blood cell count, and elevated CRP greater than 7, and an elevated sed rate greater than 25.  A CMP was normal.  Lupus test/ANA was normal.  [facility] spotted fever test was normal.  We will start the patient on doxycycline he was discharged from the hospital.  He presents today primarily complaining of a rash and swelling in his legs.  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 ?At that time, my plan was: ? 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  Begin by checking a urinalysis evaluating for proteinuria or hematuria.  I will also check a urine protein to creatinine ratio to get an estimate of the severity of the proteinuria.  Check an ANCA to evaluate for the possibility of Wegener's disease versus Goodpasture's.  Check a rheumatoid factor.  Check a hepatitis panel to evaluate for hepatitis B or C.  Check a CBC to evaluate for evidence of hemolysis or thrombocytopenia.  Check a sedimentation rate and a C-reactive protein to see if this is trending downward after holding allopurinol.  Check a cryoglobulin screen.   Await the results of the blood test.  If patient develops gross hematuria, shortness of breath, chest pain, hemoptysis, he is directed to go immediately to the hospital.  At the present time the vasculitis seems to be limited to the skin._0 ? ?03/02/22 ?ESR and CRP were elevated.  RF borderline.  ANA and ANCA were negative.  CBC and CMP were normal.  UA showed elevated WBC but not hematuria or proteinuria.  Rheumatology referral and nephrology referral are pending.  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   ? ? ?Patient continues to deny any systemic involvement.  Specifically denies any fevers chills or weight loss.  He denies any arthralgias.  He denies any abdominal pain.  He denies any cough or shortness of breath or chest pain or pleurisy or hemoptysis.  So far it seems to be limited to the skin.  However there was the white blood cells seen on his urinalysis.  Patient states that he has a history of urinary tract infections and has been treated multiple times for cystitis in the past.  Therefore the white blood cells could be unrelated to the vasculitis.  He agrees to allow me to check his urine again today.  He denies any dysuria or urgency or frequency. ? ?Past Medical History:  ?Diagnosis Date  ? Gout   ?  Prediabetes   ? ?No past surgical history on file. ?Current Outpatient Medications on File Prior to Visit  ?Medication Sig Dispense Refill  ? doxycycline (VIBRAMYCIN) 100 MG capsule Take 1 capsule (100 mg total) by mouth 2 (two) times daily. One po bid x 7 days (Patient not taking: Reported on 02/16/2022) 14 capsule 0  ? furosemide (LASIX) 40 MG tablet Take 1 tablet (40 mg total) by mouth daily. 30 tablet 3  ? HYDROcodone-acetaminophen (NORCO) 5-325 MG tablet Take 1 tablet by mouth every 6 (six) hours as needed for moderate pain. 30 tablet 0  ? predniSONE (STERAPRED UNI-PAK 48 TAB) 10 MG (48) TBPK tablet Take by mouth as directed. (Patient not  taking: Reported on 02/16/2022)    ? ?No current facility-administered medications on file prior to visit.  ? ?No Known Allergies ?Social History  ? ?Socioeconomic History  ? Marital status: Married  ?  Spouse name: Not on file  ? Number of children: Not on file  ? Years of education: Not on file  ? Highest education level: Not on file  ?Occupational History  ? Not on file  ?Tobacco Use  ? Smoking status: Never  ? Smokeless tobacco: Never  ?Substance and Sexual Activity  ? Alcohol use: No  ? Drug use: No  ? Sexual activity: Yes  ?Other Topics Concern  ? Not on file  ?Social History Narrative  ? Not on file  ? ?Social Determinants of Health  ? ?Financial Resource Strain: Not on file  ?Food Insecurity: Not on file  ?Transportation Needs: Not on file  ?Physical Activity: Not on file  ?Stress: Not on file  ?Social Connections: Not on file  ?Intimate Partner Violence: Not on file  ? ? ? ? ?Review of Systems  ?Constitutional:  Positive for fatigue.  ?HENT:  Negative for nosebleeds.   ?Respiratory:  Negative for cough, shortness of breath and wheezing.   ?Cardiovascular:  Positive for leg swelling. Negative for chest pain.  ?Gastrointestinal:  Negative for abdominal pain.  ?Genitourinary:  Negative for dysuria and hematuria.  ?Skin:  Positive for rash.  ? ?   ?Objective:  ? Physical Exam ?Constitutional:   ?   General: He is not in acute distress. ?   Appearance: Normal appearance. He is obese. He is not ill-appearing or toxic-appearing.  ?Cardiovascular:  ?   Rate and Rhythm: Normal rate and regular rhythm.  ?   Pulses: Normal pulses.  ?   Heart sounds: Normal heart sounds.  ?Pulmonary:  ?   Effort: Pulmonary effort is normal.  ?   Breath sounds: Normal breath sounds.  ?Musculoskeletal:  ?   Right lower leg: Edema present.  ?   Left lower leg: Edema present.  ?Skin: ?   Findings: Rash present. Rash is macular and purpuric.  ?Neurological:  ?   Mental Status: He is alert.  ? ? ? ? ? ?   ?Assessment & Plan:  ?Vasculitis  ([place]) - Plan: Pathology Report ([facility]), COMPLETE METABOLIC PANEL WITH GFR, Urinalysis, Routine w reflex microscopic ?Patient has vasculitis but at the present time the only organ involvement is the skin.  I will repeat a urinalysis and a CMP today to look for any signs of renal hepatic involvement evidence of glomerulonephritis.  I will put in a referral to nephrology urgently for a second opinion given the pyuria.  However it appears to be limited to the skin.  He has an appointment to see rheumatology in May 20 which seems unreasonable.  Therefore more for the patient's referral out of system to [facility] or [place] to try to expedite the referral as I do not feel that this should wait 1 month.  I will start the patient on prednisone 60 mg a day until seen by rheumatology to determine long-term therapy.  HSP is on differential but there is no abdominal pain.  Cutaneous leukocytoclastic small vessel vasculitis is also on differential.  Punch biopsy is sent to pathology today.   ?

## 2022-03-03 LAB — URINALYSIS, ROUTINE W REFLEX MICROSCOPIC
Bilirubin Urine: NEGATIVE
Glucose, UA: NEGATIVE
Ketones, ur: NEGATIVE
Nitrite: NEGATIVE
Protein, ur: NEGATIVE
Specific Gravity, Urine: 1.017 (ref 1.001–1.035)
WBC, UA: >=60 /HPF — AB (ref 0–5)
pH: 7 (ref 5.0–8.0)

## 2022-03-03 LAB — COMPLETE METABOLIC PANEL WITH GFR
AG Ratio: 1.1 (calc) (ref 1.0–2.5)
ALT: 12 U/L (ref 9–46)
AST: 17 U/L (ref 10–35)
Albumin: 3.9 g/dL (ref 3.6–5.1)
Alkaline phosphatase (APISO): 68 U/L (ref 35–144)
BUN: 22 mg/dL (ref 7–25)
CO2: 28 mmol/L (ref 20–32)
Calcium: 9.4 mg/dL (ref 8.6–10.3)
Chloride: 99 mmol/L (ref 98–110)
Creat: 1.18 mg/dL (ref 0.70–1.30)
Globulin: 3.5 g/dL (calc) (ref 1.9–3.7)
Glucose, Bld: 144 mg/dL — ABNORMAL HIGH (ref 65–99)
Potassium: 4.7 mmol/L (ref 3.5–5.3)
Sodium: 142 mmol/L (ref 135–146)
Total Bilirubin: 0.6 mg/dL (ref 0.2–1.2)
Total Protein: 7.4 g/dL (ref 6.1–8.1)
eGFR: 72 mL/min/{1.73_m2} (ref >=60)

## 2022-03-03 LAB — MICROSCOPIC MESSAGE

## 2022-03-08 ENCOUNTER — Telehealth: Payer: Self-pay

## 2022-03-08 ENCOUNTER — Other Ambulatory Visit: Payer: Self-pay

## 2022-03-08 LAB — PATHOLOGY REPORT

## 2022-03-08 LAB — TISSUE SPECIMEN

## 2022-03-08 MED ORDER — CEPHALEXIN 500 MG PO CAPS: 500.0000 mg | ORAL_CAPSULE | Freq: Three times a day (TID) | ORAL | 0 refills | Status: DC

## 2022-03-08 NOTE — Telephone Encounter (Signed)
Spoke with patient and he states his legs are still very painful. He would like to know if you could send in anything for the pain. ? ?Please advise, thanks! ?

## 2022-03-09 ENCOUNTER — Other Ambulatory Visit: Payer: No Typology Code available for payment source

## 2022-03-13 ENCOUNTER — Other Ambulatory Visit: Payer: Self-pay | Admitting: Family Medicine

## 2022-03-13 MED ORDER — ALPRAZOLAM 0.5 MG PO TABS: 0.5000 mg | ORAL_TABLET | Freq: Every evening | ORAL | 1 refills | Status: AC | PRN

## 2022-03-13 NOTE — Telephone Encounter (Signed)
Spoke with patient and he states rash has improved significantly. He states most areas are scabbing over and very few open areas remain. He did see a Dr. Lahoma Rocker at [facility] today and was given Gabapentin 100mg  QHS, he plans to start this tonight. He states he has intense pain when trying to sleep, as anything touching his legs is painful. He would like to know if you can send in something to help him sleep? ? ?Please advise, thanks! ?

## 2022-03-13 NOTE — Telephone Encounter (Signed)
Patient aware of rx

## 2022-03-16 ENCOUNTER — Telehealth: Payer: Self-pay

## 2022-03-16 ENCOUNTER — Other Ambulatory Visit: Payer: No Typology Code available for payment source

## 2022-03-16 DIAGNOSIS — R7303 Prediabetes: Secondary | ICD-10-CM

## 2022-03-16 LAB — URINALYSIS, ROUTINE W REFLEX MICROSCOPIC
Bilirubin Urine: NEGATIVE
Crystals: NONE SEEN /HPF
Ketones, ur: NEGATIVE
Nitrite: NEGATIVE
Protein, ur: NEGATIVE
Specific Gravity, Urine: 1.025 (ref 1.001–1.035)
Yeast: NONE SEEN /HPF
pH: 5 (ref 5.0–8.0)

## 2022-03-16 LAB — MICROSCOPIC MESSAGE

## 2022-03-16 NOTE — Telephone Encounter (Signed)
Per Dr. Tanya NonesPickard patient's UA today looks much improved, there are no signs of residual infection. ? ?LMTRC to advise.  ?

## 2022-03-16 NOTE — Telephone Encounter (Signed)
Patient aware. Nothing further needed at this time.   

## 2022-03-25 NOTE — Progress Notes (Deleted)
   Office Visit Note  Patient: Walter [name]             Date of Birth: 11/03/1966           MRN: [id number]             PCP: Susy Frizzle, MD Referring: Susy Frizzle, MD Visit Date: 03/28/2022 Occupation: @GUAROCC @  Subjective:  No chief complaint on file.   History of Present Illness: Walter [name] is a 56 y.o. male ***   Activities of Daily Living:  Patient reports morning stiffness for *** {minute/hour:19697}.   Patient {ACTIONS;DENIES/REPORTS:21021675::"Denies"} nocturnal pain.  Difficulty dressing/grooming: {ACTIONS;DENIES/REPORTS:21021675::"Denies"} Difficulty climbing stairs: {ACTIONS;DENIES/REPORTS:21021675::"Denies"} Difficulty getting out of chair: {ACTIONS;DENIES/REPORTS:21021675::"Denies"} Difficulty using hands for taps, buttons, cutlery, and/or writing: {ACTIONS;DENIES/REPORTS:21021675::"Denies"}  No Rheumatology ROS completed.   PMFS History:  Patient Active Problem List   Diagnosis Date Noted   Prediabetes    Gout     Past Medical History:  Diagnosis Date   Gout    Prediabetes     No family history on file. No past surgical history on file. Social History   Social History Narrative   Not on file   Immunization History  Administered Date(s) Administered   Influenza-Unspecified 09/19/2018     Objective: Vital Signs: There were no vitals taken for this visit.   Physical Exam   Musculoskeletal Exam: ***  CDAI Exam: CDAI Score: -- Patient Global: --; Provider Global: -- Swollen: --; Tender: -- Joint Exam 03/28/2022   No joint exam has been documented for this visit   There is currently no information documented on the homunculus. Go to the Rheumatology activity and complete the homunculus joint exam.  Investigation: No additional findings.  Imaging: No results found.  Recent Labs: Lab Results  Component Value Date   WBC 10.3 02/16/2022   HGB 15.0 02/16/2022   PLT 298 02/16/2022   NA 142 03/02/2022   K 4.7  03/02/2022   CL 99 03/02/2022   CO2 28 03/02/2022   GLUCOSE 144 (H) 03/02/2022   BUN 22 03/02/2022   CREATININE 1.18 03/02/2022   BILITOT 0.6 03/02/2022   ALKPHOS 65 02/12/2022   AST 17 03/02/2022   ALT 12 03/02/2022   PROT 7.4 03/02/2022   ALBUMIN 3.7 02/12/2022   CALCIUM 9.4 03/02/2022   GFRAA 97 05/08/2019    Speciality Comments: No specialty comments available.  Procedures:  No procedures performed Allergies: Patient has no known allergies.   Assessment / Plan:     Visit Diagnoses: No diagnosis found.  Orders: No orders of the defined types were placed in this encounter.  No orders of the defined types were placed in this encounter.   Face-to-face time spent with patient was *** minutes. Greater than 50% of time was spent in counseling and coordination of care.  Follow-Up Instructions: No follow-ups on file.   Collier Salina, MD  Note - This record has been created using [employer].  Chart creation errors have been sought, but may not always  have been located. Such creation errors do not reflect on  the standard of medical care.

## 2022-03-28 ENCOUNTER — Ambulatory Visit: Payer: No Typology Code available for payment source | Admitting: Internal Medicine

## 2022-03-29 ENCOUNTER — Other Ambulatory Visit: Payer: Self-pay

## 2022-03-29 ENCOUNTER — Other Ambulatory Visit: Payer: Self-pay | Admitting: Family Medicine

## 2022-03-29 NOTE — Telephone Encounter (Signed)
Requested medication (s) are due for refill today: yes ? ?Requested medication (s) are on the active medication list: yes ? ?Last refill:  03/02/22 #90/0 ? ?Future visit scheduled: no ? ?Notes to clinic:  Unable to refill per protocol, cannot delegate. ? ? ?  ?Requested Prescriptions  ?Pending Prescriptions Disp Refills  ? predniSONE (DELTASONE) 20 MG tablet [Pharmacy Med Name: PREDNISONE 20 MG TABLET] 90 tablet 0  ?  Sig: TAKE 3 TABLETS BY MOUTH DAILY WITH BREAKFAST.  ?  ? Not Delegated - Endocrinology:  Oral Corticosteroids Failed - 03/29/2022  2:38 AM  ?  ?  Failed - This refill cannot be delegated  ?  ?  Failed - Manual Review: Eye exam for IOP if prolonged treatment  ?  ?  Failed - Glucose (serum) in normal range and within 180 days  ?  Glucose, Bld  ?Date Value Ref Range Status  ?03/02/2022 144 (H) 65 - 99 mg/dL Final  ?  Comment:  ?  . ?           Fasting reference interval ?. ?For someone without known diabetes, a glucose ?value >125 mg/dL indicates that they may have ?diabetes and this should be confirmed with a ?follow-up test. ?. ?  ?   ?  ?  Failed - Bone Mineral Density or Dexa Scan completed in the last 2 years  ?  ?  Passed - K in normal range and within 180 days  ?  Potassium  ?Date Value Ref Range Status  ?03/02/2022 4.7 3.5 - 5.3 mmol/L Final  ?   ?  ?  Passed - Na in normal range and within 180 days  ?  Sodium  ?Date Value Ref Range Status  ?03/02/2022 142 135 - 146 mmol/L Final  ?   ?  ?  Passed - Last BP in normal range  ?  BP Readings from Last 1 Encounters:  ?03/02/22 118/82  ?   ?  ?  Passed - Valid encounter within last 6 months  ?  Recent Outpatient Visits   ? ?      ? 1 month ago Vasculitis (HCC)  ? [facility] Pickard, Priscille Heidelberg, MD  ? 5 months ago Hypoxia  ? [facility] Valentino Nose, NP  ? 6 months ago Wheezing  ? [facility] Valentino Nose, NP  ? 1 year ago COVID-19  ? [facility] Pickard, Priscille Heidelberg, MD  ? 1  year ago COVID-19  ? [facility] Pickard, Priscille Heidelberg, MD  ? ?  ?  ? ? ?  ?  ?  ? ?

## 2022-05-28 ENCOUNTER — Other Ambulatory Visit: Payer: Self-pay | Admitting: Family Medicine

## 2022-06-01 ENCOUNTER — Telehealth: Payer: Self-pay

## 2022-06-01 ENCOUNTER — Other Ambulatory Visit: Payer: Self-pay | Admitting: Family Medicine

## 2022-06-01 ENCOUNTER — Encounter (HOSPITAL_BASED_OUTPATIENT_CLINIC_OR_DEPARTMENT_OTHER): Payer: BC Managed Care – PPO | Attending: General Surgery | Admitting: Internal Medicine

## 2022-06-01 DIAGNOSIS — Z6841 Body Mass Index (BMI) 40.0 and over, adult: Secondary | ICD-10-CM | POA: Insufficient documentation

## 2022-06-01 DIAGNOSIS — L97528 Non-pressure chronic ulcer of other part of left foot with other specified severity: Secondary | ICD-10-CM | POA: Insufficient documentation

## 2022-06-01 DIAGNOSIS — L97818 Non-pressure chronic ulcer of other part of right lower leg with other specified severity: Secondary | ICD-10-CM | POA: Diagnosis not present

## 2022-06-01 DIAGNOSIS — L97828 Non-pressure chronic ulcer of other part of left lower leg with other specified severity: Secondary | ICD-10-CM | POA: Diagnosis not present

## 2022-06-01 DIAGNOSIS — J449 Chronic obstructive pulmonary disease, unspecified: Secondary | ICD-10-CM | POA: Diagnosis not present

## 2022-06-01 DIAGNOSIS — M310 Hypersensitivity angiitis: Secondary | ICD-10-CM | POA: Insufficient documentation

## 2022-06-01 DIAGNOSIS — L97518 Non-pressure chronic ulcer of other part of right foot with other specified severity: Secondary | ICD-10-CM | POA: Insufficient documentation

## 2022-06-01 MED ORDER — HYDROCODONE-ACETAMINOPHEN 5-325 MG PO TABS: 1.0000 | ORAL_TABLET | Freq: Four times a day (QID) | ORAL | 0 refills | Status: AC | PRN

## 2022-06-01 NOTE — Progress Notes (Signed)
Walter [name] ([id number]) , . Visit Report for 06/01/2022 Abuse Risk Screen Details Patient Name: Date of Service: Walter [name], Walter [name] [facility] T. 06/01/2022 8:00 A M Medical Record Number: [id number] Patient Account Number: [id number] Date of Birth/Sex: Treating RN: 26-Feb-1966 (56 y.o. Dianna Limbo Primary Care Pleshette Tomasini: Lynnea Ferrier Other Clinician: Referring Harjit Leider: Treating Wrangler Penning/Extender: Farrel Conners, GO V INDA Weeks in Treatment: 0 Abuse Risk Screen Items Answer ABUSE RISK SCREEN: Has anyone close to you tried to hurt or harm you recentlyo No Do you feel uncomfortable with anyone in your familyo No Has anyone forced you do things that you didnt want to doo No Electronic Signature(s) Signed: 06/01/2022 6:44:27 PM By: Karie Schwalbe RN Entered By: Karie Schwalbe on 06/01/2022 08:21:53 -------------------------------------------------------------------------------- Activities of Daily Living Details Patient Name: Date of Service: Walter [name], Walter [name] [facility] T. 06/01/2022 8:00 A M Medical Record Number: [id number] Patient Account Number: [id number] Date of Birth/Sex: Treating RN: 03-02-1966 (56 y.o. Dianna Limbo Primary Care Henreitta Spittler: Lynnea Ferrier Other Clinician: Referring Aravind Chrismer: Treating Chiyeko Ferre/Extender: Farrel Conners, GO V INDA Weeks in Treatment: 0 Activities of Daily Living Items Answer Activities of Daily Living (Please select one for each item) Drive Automobile Completely Able T Medications ake Completely Able Use T elephone Completely Able Care for Appearance Completely Able Use T oilet Completely Able Bath / Shower Completely Able Dress Self Completely Able Feed Self Completely Able Walk Completely Able Get In / Out Bed Completely Able Housework Completely Able Prepare Meals Completely Able Handle Money Completely Able Shop for Self Completely Able Electronic Signature(s) Signed: 06/01/2022 6:44:27 PM By: Karie Schwalbe RN Entered  By: Karie Schwalbe on 06/01/2022 08:22:21 -------------------------------------------------------------------------------- Education Screening Details Patient Name: Date of Service: Walter [name] [facility] T. 06/01/2022 8:00 A M Medical Record Number: [id number] Patient Account Number: [id number] Date of Birth/Sex: Treating RN: [birth year] (56 y.o. Dianna Limbo Primary Care Christne Platts: Lynnea Ferrier Other Clinician: Referring Ozie Dimaria: Treating Yurem Viner/Extender: Farrel Conners, GO V INDA Weeks in Treatment: 0 Learning Preferences/Education Level/Primary Language Learning Preference: Explanation, Demonstration, Printed Material Highest Education Level: College or Above Preferred Language: English Cognitive Barrier Language Barrier: No Translator Needed: No Memory Deficit: No Emotional Barrier: No Cultural/Religious Beliefs Affecting Medical Care: No Physical Barrier Impaired Vision: No Impaired Hearing: No Decreased Hand dexterity: No Knowledge/Comprehension Knowledge Level: High Comprehension Level: High Ability to understand written instructions: High Ability to understand verbal instructions: High Motivation Anxiety Level: Calm Cooperation: Cooperative Education Importance: Acknowledges Need Interest in Health Problems: Asks Questions Perception: Coherent Willingness to Engage in Self-Management High Activities: Readiness to Engage in Self-Management High Activities: Electronic Signature(s) Signed: 06/01/2022 6:44:27 PM By: Karie Schwalbe RN Entered By: Karie Schwalbe on 06/01/2022 08:24:23 -------------------------------------------------------------------------------- Fall Risk Assessment Details Patient Name: Date of Service: Walter [name], Walter [name] T. 06/01/2022 8:00 A M Medical Record Number: [id number] Patient Account Number: [id number] Date of Birth/Sex: Treating RN: [birth year] (56 y.o. Dianna Limbo Primary Care Minnie Shi: Lynnea Ferrier Other  Clinician: Referring Lidiya Reise: Treating Daesean Lazarz/Extender: Farrel Conners, GO V INDA Weeks in Treatment: 0 Fall Risk Assessment Items Have you had 2 or more falls in the last 12 monthso 0 No Have you had any fall that resulted in injury in the last 12 monthso 0 No FALLS RISK SCREEN History of falling - immediate or within 3 months 0 No Secondary diagnosis (Do you have 2 or more medical diagnoseso) 0 No Ambulatory aid None/bed rest/wheelchair/nurse 0 No Crutches/cane/walker 0 No Furniture 0 No Intravenous  therapy Access/Saline/Heparin Lock 0 No Gait/Transferring Normal/ bed rest/ wheelchair 0 No Weak (short steps with or without shuffle, stooped but able to lift head while walking, may seek 0 No support from furniture) Impaired (short steps with shuffle, may have difficulty arising from chair, head down, impaired 0 No balance) Mental Status Oriented to own ability 0 No Electronic Signature(s) Signed: 06/01/2022 6:44:27 PM By: Karie Schwalbe RN Entered By: Karie Schwalbe on 06/01/2022 08:24:57 -------------------------------------------------------------------------------- Foot Assessment Details Patient Name: Date of Service: Walter [name], Walter [name] [facility] T. 06/01/2022 8:00 A M Medical Record Number: [id number] Patient Account Number: [id number] Date of Birth/Sex: Treating RN: 1965-12-25 (56 y.o. Dianna Limbo Primary Care Darleene Cumpian: Lynnea Ferrier Other Clinician: Referring Judene Logue: Treating Jaimie Pippins/Extender: Farrel Conners, GO V INDA Weeks in Treatment: 0 Foot Assessment Items Site Locations + = Sensation present, - = Sensation absent, C = Callus, U = Ulcer R = Redness, W = Warmth, M = Maceration, PU = Pre-ulcerative lesion F = Fissure, S = Swelling, D = Dryness Assessment Right: Left: Other Deformity: No No Prior Foot Ulcer: No No Prior Amputation: No No Charcot Joint: No No Ambulatory Status: Ambulatory With Help Assistance Device: Wheelchair Gait:  Steady Electronic Signature(s) Signed: 06/01/2022 6:44:27 PM By: Karie Schwalbe RN Entered By: Karie Schwalbe on 06/01/2022 08:36:42 -------------------------------------------------------------------------------- Nutrition Risk Screening Details Patient Name: Date of Service: Walter [name], Walter [name] [facility] T. 06/01/2022 8:00 A M Medical Record Number: [id number] Patient Account Number: [id number] Date of Birth/Sex: Treating RN: [birth year] (56 y.o. Dianna Limbo Primary Care Jericha Bryden: Lynnea Ferrier Other Clinician: Referring Briselda Naval: Treating Gurjot Brisco/Extender: Farrel Conners, GO V INDA Weeks in Treatment: 0 Height (in): 75 Weight (lbs): 325 Body Mass Index (BMI): 40.6 Nutrition Risk Screening Items Score Screening NUTRITION RISK SCREEN: I have an illness or condition that made me change the kind and/or amount of food I eat 0 No I eat fewer than two meals per day 0 No I eat few fruits and vegetables, or milk products 0 No I have three or more drinks of beer, liquor or wine almost every day 0 No I have tooth or mouth problems that make it hard for me to eat 0 No I don't always have enough money to buy the food I need 0 No I eat alone most of the time 0 No I take three or more different prescribed or over-the-counter drugs a day 0 No Without wanting to, I have lost or gained 10 pounds in the last six months 0 No I am not always physically able to shop, cook and/or feed myself 0 No Nutrition Protocols Good Risk Protocol 0 No interventions needed Moderate Risk Protocol High Risk Proctocol Risk Level: Good Risk Score: 0 Electronic Signature(s) Signed: 06/01/2022 6:44:27 PM By: Karie Schwalbe RN Entered By: Karie Schwalbe on 06/01/2022 08:25:12

## 2022-06-01 NOTE — Telephone Encounter (Signed)
Pt called stating that pt's legs are wrapped due to vasculitis and its really painful and is having hard time sleeping.   Pt would like a Rx to help w/leg pain and sleep?

## 2022-06-01 NOTE — Telephone Encounter (Signed)
Pt's wife came in to ask for some pain meds to be sent [facility] at [facility]. Pt's wife stated that pt has been to seen by wound specialist today who recommended that pt get pain meds from pcp. Please call pt if this is possible.   LOV:03/02/22  CB#: [id number]

## 2022-06-01 NOTE — Progress Notes (Signed)
DERRAN REI ([id number]) , . Visit Report for 06/01/2022 Allergy List Details Patient Name: Date of Service: SHANDEN, PAAR [facility] T. 06/01/2022 8:00 A M Medical Record Number: [id number] Patient Account Number: [id number] Date of Birth/Sex: Treating RN: 15-Oct-1966 (56 y.o. [name] Primary Care Kaizer Dissinger: Jenna Luo Other Clinician: Referring Makani Seckman: Treating Eh Sauseda/Extender: Claris Pong, GO V INDA Weeks in Treatment: 0 Allergies Active Allergies No Known Allergies Allergy Notes Electronic Signature(s) Signed: 06/01/2022 6:44:27 PM By: Dellie Catholic RN Entered By: Dellie Catholic on 06/01/2022 08:13:43 -------------------------------------------------------------------------------- Arrival Information Details Patient Name: Date of Service: JABER, BOOTHE [facility] T. 06/01/2022 8:00 A M Medical Record Number: [id number] Patient Account Number: [id number] Date of Birth/Sex: Treating RN: 1966-09-30 (56 y.o. [name] Primary Care Payslee Bateson: Jenna Luo Other Clinician: Referring Heavan Francom: Treating Odell Choung/Extender: Claris Pong, GO V INDA Weeks in Treatment: 0 Visit Information Patient Arrived: Ambulatory Arrival Time: 08:10 Accompanied By: spouse Transfer Assistance: None Patient Identification Verified: Yes Electronic Signature(s) Signed: 06/01/2022 6:44:27 PM By: Dellie Catholic RN Entered By: Dellie Catholic on 06/01/2022 08:10:36 -------------------------------------------------------------------------------- Clinic Level of Care Assessment Details Patient Name: Date of Service: JIARUI, SEAT [facility] T. 06/01/2022 8:00 A M Medical Record Number: [id number] Patient Account Number: [id number] Date of Birth/Sex: Treating RN: 1966/05/29 (56 y.o. [name] Primary Care Arthella Headings: Jenna Luo Other Clinician: Referring Jedadiah Abdallah: Treating Aziah Brostrom/Extender: Claris Pong, GO V INDA Weeks in Treatment: 0 Clinic Level of  Care Assessment Items TOOL 1 Quantity Score X- 1 0 Use when EandM and Procedure is performed on INITIAL visit ASSESSMENTS - Nursing Assessment / Reassessment X- 1 20 General Physical Exam (combine w/ comprehensive assessment (listed just below) when performed on new pt. evals) X- 1 25 Comprehensive Assessment (HX, ROS, Risk Assessments, Wounds Hx, etc.) ASSESSMENTS - Wound and Skin Assessment / Reassessment X- 1 10 Dermatologic / Skin Assessment (not related to wound area) ASSESSMENTS - Ostomy and/or Continence Assessment and Care []  - 0 Incontinence Assessment and Management []  - 0 Ostomy Care Assessment and Management (repouching, etc.) PROCESS - Coordination of Care []  - 0 Simple Patient / Family Education for ongoing care X- 1 20 Complex (extensive) Patient / Family Education for ongoing care X- 1 10 Staff obtains Programmer, systems, Records, T Results / Process Orders est []  - 0 Staff telephones HHA, Nursing Homes / Clarify orders / etc []  - 0 Routine Transfer to another Facility (non-emergent condition) []  - 0 Routine Hospital Admission (non-emergent condition) X- 1 15 New Admissions / Biomedical engineer / Ordering NPWT Apligraf, etc. , []  - 0 Emergency Hospital Admission (emergent condition) PROCESS - Special Needs []  - 0 Pediatric / Minor Patient Management []  - 0 Isolation Patient Management []  - 0 Hearing / Language / Visual special needs []  - 0 Assessment of Community assistance (transportation, D/C planning, etc.) []  - 0 Additional assistance / Altered mentation []  - 0 Support Surface(s) Assessment (bed, cushion, seat, etc.) INTERVENTIONS - Miscellaneous []  - 0 External ear exam []  - 0 Patient Transfer (multiple staff / Civil Service fast streamer / Similar devices) []  - 0 Simple Staple / Suture removal (25 or less) []  - 0 Complex Staple / Suture removal (26 or more) []  - 0 Hypo/Hyperglycemic Management (do not check if billed separately) []  - 0 Ankle / Brachial  Index (ABI) - do not check if billed separately Has the patient been seen at the hospital within the last three years: Yes Total Score: 100 Level Of Care: New/Established - Level  3 Electronic Signature(s) Signed: 06/01/2022 6:44:27 PM By: Dellie Catholic RN Entered By: Dellie Catholic on 06/01/2022 18:38:46 -------------------------------------------------------------------------------- Compression Therapy Details Patient Name: Date of Service: TOBIUS, SONI [facility] T. 06/01/2022 8:00 A M Medical Record Number: [id number] Patient Account Number: [id number] Date of Birth/Sex: Treating RN: [birth year] (56 y.o. [name] Primary Care Zaria Taha: Jenna Luo Other Clinician: Referring Anterrio Mccleery: Treating Kasai Beltran/Extender: Claris Pong, GO V INDA Weeks in Treatment: 0 Compression Therapy Performed for Wound Assessment: Wound #1 Left,Circumferential Lower Leg Performed By: Clinician Dellie Catholic, RN Compression Type: Three Layer Post Procedure Diagnosis Same as Pre-procedure Electronic Signature(s) Signed: 06/01/2022 6:44:27 PM By: Dellie Catholic RN Entered By: Dellie Catholic on 06/01/2022 17:22:18 -------------------------------------------------------------------------------- Compression Therapy Details Patient Name: Date of Service: CURLY, DATU [facility] T. 06/01/2022 8:00 A M Medical Record Number: [id number] Patient Account Number: [id number] Date of Birth/Sex: Treating RN: Feb 28, 1966 (56 y.o. [name] Primary Care Antoinett Dorman: Jenna Luo Other Clinician: Referring Syre Knerr: Treating Ruby Dilone/Extender: Claris Pong, GO V INDA Weeks in Treatment: 0 Compression Therapy Performed for Wound Assessment: Wound #2 Right,Circumferential Lower Leg Performed By: Clinician Dellie Catholic, RN Compression Type: Three Layer Post Procedure Diagnosis Same as Pre-procedure Electronic Signature(s) Signed: 06/01/2022 6:44:27 PM By: Dellie Catholic  RN Entered By: Dellie Catholic on 06/01/2022 17:22:19 -------------------------------------------------------------------------------- Compression Therapy Details Patient Name: Date of Service: BOLDEN, MEST [facility] T. 06/01/2022 8:00 A M Medical Record Number: [id number] Patient Account Number: [id number] Date of Birth/Sex: Treating RN: 1966/05/30 (56 y.o. [name] Primary Care Joline Encalada: Jenna Luo Other Clinician: Referring Jensyn Shave: Treating Daiya Tamer/Extender: Claris Pong, GO V INDA Weeks in Treatment: 0 Compression Therapy Performed for Wound Assessment: Wound #3 Right,Dorsal Foot Performed By: Clinician Dellie Catholic, RN Compression Type: Three Layer Post Procedure Diagnosis Same as Pre-procedure Electronic Signature(s) Signed: 06/01/2022 6:44:27 PM By: Dellie Catholic RN Entered By: Dellie Catholic on 06/01/2022 17:22:19 -------------------------------------------------------------------------------- Compression Therapy Details Patient Name: Date of Service: VIOLET, BOIVIN [facility] T. 06/01/2022 8:00 A M Medical Record Number: [id number] Patient Account Number: [id number] Date of Birth/Sex: Treating RN: 07/11/1966 (56 y.o. [name] Primary Care Melanny Wire: Jenna Luo Other Clinician: Referring Ermon Sagan: Treating Sonya Pucci/Extender: Claris Pong, GO V INDA Weeks in Treatment: 0 Compression Therapy Performed for Wound Assessment: Wound #4 Left,Dorsal Foot Performed By: Clinician Dellie Catholic, RN Compression Type: Three Layer Post Procedure Diagnosis Same as Pre-procedure Electronic Signature(s) Signed: 06/01/2022 6:44:27 PM By: Dellie Catholic RN Entered By: Dellie Catholic on 06/01/2022 17:22:19 -------------------------------------------------------------------------------- Encounter Discharge Information Details Patient Name: Date of Service: KYRIS, MARSCHKE [facility] T. 06/01/2022 8:00 A M Medical Record Number: [id number] Patient  Account Number: [id number] Date of Birth/Sex: Treating RN: 1965/12/31 (56 y.o. [name] Primary Care Anu Stagner: Jenna Luo Other Clinician: Referring Addis Tuohy: Treating Manuel Dall/Extender: Claris Pong, GO V INDA Weeks in Treatment: 0 Encounter Discharge Information Items Discharge Condition: Stable Ambulatory Status: Ambulatory Discharge Destination: Home Transportation: Private Auto Accompanied By: spouse Schedule Follow-up Appointment: Yes Clinical Summary of Care: Patient Declined Electronic Signature(s) Signed: 06/01/2022 6:44:27 PM By: Dellie Catholic RN Entered By: Dellie Catholic on 06/01/2022 18:42:34 -------------------------------------------------------------------------------- Lower Extremity Assessment Details Patient Name: Date of Service: OBALOLUWA, GEARS [facility] T. 06/01/2022 8:00 A M Medical Record Number: [id number] Patient Account Number: [id number] Date of Birth/Sex: Treating RN: 1966/02/14 (56 y.o. [name] Primary Care Hughie Melroy: Jenna Luo Other Clinician: Referring Udell Mazzocco: Treating Zimal Weisensel/Extender: Claris Pong, GO V INDA Weeks in Treatment: 0 Edema Assessment Assessed: [Left:  No] [Right: No] E[Left: dema] [Right: :] Calf Left: Right: Point of Measurement: 33 cm From Medial Instep 53.5 cm 54.5 cm Ankle Left: Right: Point of Measurement: 11 cm From Medial Instep 34 cm 33.5 cm Knee To Floor Left: Right: From Medial Instep 47 cm 47 cm Vascular Assessment Pulses: Dorsalis Pedis Palpable: [Left:Yes] [Right:Yes] Electronic Signature(s) Signed: 06/01/2022 6:44:27 PM By: Karie Schwalbe RN Entered By: Karie Schwalbe on 06/01/2022 09:04:27 -------------------------------------------------------------------------------- Multi Wound Chart Details Patient Name: Date of Service: RAUNEL, DIMARTINO [facility] T. 06/01/2022 8:00 A M Medical Record Number: [id number] Patient Account Number: [id number] Date of  Birth/Sex: Treating RN: February 29, 1966 (56 y.o. Dianna Limbo Primary Care Tasheem Elms: Lynnea Ferrier Other Clinician: Referring Phuoc Huy: Treating Chayson Charters/Extender: Farrel Conners, GO V INDA Weeks in Treatment: 0 Vital Signs Height(in): 75 Pulse(bpm): 98 Weight(lbs): 325 Blood Pressure(mmHg): 146/95 Body Mass Index(BMI): 40.6 Temperature(F): 99.3 Respiratory Rate(breaths/min): 20 Photos: Left, Circumferential Lower Leg Right, Circumferential Lower Leg Right, Dorsal Foot Wound Location: Gradually Appeared Gradually Appeared Gradually Appeared Wounding Event: Vasculitis Vasculitis Vasculitis Primary Etiology: Gout Gout Gout Comorbid History: 02/10/2022 02/10/2022 02/10/2022 Date Acquired: 0 0 0 Weeks of Treatment: Open Open Open Wound Status: No No No Wound Recurrence: Yes Yes Yes Clustered Wound: 25x35x0.2 25x35x0.2 0.3x0.3x0.1 Measurements L x W x D (cm) 687.223 687.223 0.071 A (cm) : rea 137.445 137.445 0.007 Volume (cm) : 0.00% 0.00% 0.00% % Reduction in Area: 0.00% 0.00% 0.00% % Reduction in Volume: Full Thickness Without Exposed Full Thickness Without Exposed Full Thickness Without Exposed Classification: Support Structures Support Structures Support Structures Large Large Large Exudate Amount: Serous Serous Serous Exudate Type: amber amber amber Exudate Color: Small (1-33%) Small (1-33%) Small (1-33%) Granulation Amount: Pink N/A Red, Pink Granulation Quality: Large (67-100%) Large (67-100%) Large (67-100%) Necrotic Amount: Fat Layer (Subcutaneous Tissue): Yes Fat Layer (Subcutaneous Tissue): Yes Fat Layer (Subcutaneous Tissue): Yes Exposed Structures: Fascia: No Fascia: No Fascia: No Tendon: No Tendon: No Tendon: No Muscle: No Muscle: No Muscle: No Joint: No Joint: No Joint: No Bone: No Bone: No Bone: No None None None Epithelialization: Wound Number: 4 N/A N/A Photos: N/A N/A Left, Dorsal Foot N/A N/A Wound  Location: Gradually Appeared N/A N/A Wounding Event: Vasculitis N/A N/A Primary Etiology: Gout N/A N/A Comorbid History: 02/10/2022 N/A N/A Date Acquired: 0 N/A N/A Weeks of Treatment: Open N/A N/A Wound Status: No N/A N/A Wound Recurrence: Yes N/A N/A Clustered Wound: 0.3x0.3x0.2 N/A N/A Measurements L x W x D (cm) 0.071 N/A N/A A (cm) : rea 0.014 N/A N/A Volume (cm) : 0.00% N/A N/A % Reduction in A rea: 0.00% N/A N/A % Reduction in Volume: Full Thickness Without Exposed N/A N/A Classification: Support Structures Large N/A N/A Exudate Amount: Serous N/A N/A Exudate Type: amber N/A N/A Exudate Color: Small (1-33%) N/A N/A Granulation Amount: Pink N/A N/A Granulation Quality: Large (67-100%) N/A N/A Necrotic Amount: Fat Layer (Subcutaneous Tissue): Yes N/A N/A Exposed Structures: Fascia: No Tendon: No Muscle: No Joint: No Bone: No None N/A N/A Epithelialization: Treatment Notes Electronic Signature(s) Signed: 06/01/2022 4:59:49 PM By: Baltazar Najjar MD Signed: 06/01/2022 6:44:27 PM By: Karie Schwalbe RN Entered By: Baltazar Najjar on 06/01/2022 09:35:21 -------------------------------------------------------------------------------- Multi-Disciplinary Care Plan Details Patient Name: Date of Service: JAHQUAN, KLUGH [facility] T. 06/01/2022 8:00 A M Medical Record Number: [id number] Patient Account Number: [id number] Date of Birth/Sex: Treating RN: [birth year] (56 y.o. Dianna Limbo Primary Care Abygale Karpf: Lynnea Ferrier Other Clinician: Referring Amberlyn Martinezgarcia: Treating Mayfield Schoene/Extender: Farrel Conners, GO V INDA  Weeks in Treatment: 0 Active Inactive Pain, Acute or Chronic Nursing Diagnoses: Pain, acute or chronic: actual or potential Goals: Patient/caregiver will verbalize adequate pain control between visits Date Initiated: 06/01/2022 Target Resolution Date: 08/01/2022 Goal Status: Active Interventions: Provide education on pain  management Notes: Electronic Signature(s) Signed: 06/01/2022 6:44:27 PM By: Dellie Catholic RN Entered By: Dellie Catholic on 06/01/2022 17:23:14 -------------------------------------------------------------------------------- Pain Assessment Details Patient Name: Date of Service: BRAYLENN, MOHAMMADI [facility] T. 06/01/2022 8:00 A M Medical Record Number: [id number] Patient Account Number: [id number] Date of Birth/Sex: Treating RN: 03-30-1966 (56 y.o. [name] Primary Care Mirela Parsley: Jenna Luo Other Clinician: Referring Elvis Boot: Treating Ivis Henneman/Extender: Claris Pong, GO V INDA Weeks in Treatment: 0 Active Problems Location of Pain Severity and Description of Pain Patient Has Paino Yes Site Locations Pain Location: Generalized Pain, Pain in Ulcers With Dressing Change: Yes Duration of the Pain. Constant / Intermittento Constant Rate the pain. Current Pain Level: 9 Worst Pain Level: 10 Least Pain Level: 7 Tolerable Pain Level: 7 Character of Pain Describe the Pain: Burning Pain Management and Medication Current Pain Management: Medication: Yes Cold Application: No Rest: Yes Massage: No Activity: No T.E.N.S.: No Heat Application: No Leg drop or elevation: No Is the Current Pain Management Adequate: Adequate How does your wound impact your activities of daily livingo Sleep: No Bathing: No Appetite: No Relationship With Others: No Bladder Continence: No Emotions: No Bowel Continence: No Work: No Toileting: No Drive: No Dressing: No Hobbies: No Electronic Signature(s) Signed: 06/01/2022 6:44:27 PM By: Dellie Catholic RN Entered By: Dellie Catholic on 06/01/2022 08:55:02 -------------------------------------------------------------------------------- Patient/Caregiver Education Details Patient Name: Date of Service: Koren Shiver [facility] T. 7/14/2023andnbsp8:00 A M Medical Record Number: [id number] Patient Account Number: [id number] Date of  Birth/Gender: Treating RN: 1966-07-01 (56 y.o. [name] Primary Care Physician: Jenna Luo Other Clinician: Referring Physician: Treating Physician/Extender: Orland Dec Weeks in Treatment: 0 Education Assessment Education Provided To: Patient Education Topics Provided Pain: Methods: Explain/Verbal Responses: Return demonstration correctly Notes MD explained that the patient would need to seek out his Primary Care Physician for Analgesics Electronic Signature(s) Signed: 06/01/2022 6:44:27 PM By: Dellie Catholic RN Entered By: Dellie Catholic on 06/01/2022 17:24:10 -------------------------------------------------------------------------------- Wound Assessment Details Patient Name: Date of Service: FAYETTE, FIESER [facility] T. 06/01/2022 8:00 A M Medical Record Number: [id number] Patient Account Number: [id number] Date of Birth/Sex: Treating RN: December 31, 1965 (56 y.o. [name] Primary Care Shirleyann Montero: Jenna Luo Other Clinician: Referring Ritchard Paragas: Treating Ximenna Fonseca/Extender: Claris Pong, GO V INDA Weeks in Treatment: 0 Wound Status Wound Number: 1 Primary Etiology: Vasculitis Wound Location: Left, Circumferential Lower Leg Wound Status: Open Wounding Event: Gradually Appeared Comorbid History: Gout Date Acquired: 02/10/2022 Weeks Of Treatment: 0 Clustered Wound: Yes Photos Wound Measurements Length: (cm) 25 Width: (cm) 35 Depth: (cm) 0.2 Area: (cm) 687.223 Volume: (cm) 137.445 % Reduction in Area: 0% % Reduction in Volume: 0% Epithelialization: None Tunneling: No Undermining: No Wound Description Classification: Full Thickness Without Exposed Support Structures Exudate Amount: Large Exudate Type: Serous Exudate Color: amber Foul Odor After Cleansing: No Slough/Fibrino Yes Wound Bed Granulation Amount: Small (1-33%) Exposed Structure Granulation Quality: Pink Fascia Exposed: No Necrotic Amount: Large  (67-100%) Fat Layer (Subcutaneous Tissue) Exposed: Yes Necrotic Quality: Adherent Slough Tendon Exposed: No Muscle Exposed: No Joint Exposed: No Bone Exposed: No Treatment Notes Wound #1 (Lower Leg) Wound Laterality: Left, Circumferential Cleanser Soap and Water Discharge Instruction: May shower and wash wound with dial antibacterial soap  and water prior to dressing change. Wound Cleanser Discharge Instruction: Cleanse the wound with wound cleanser prior to applying a clean dressing using gauze sponges, not tissue or cotton balls. Peri-Wound Care Topical Triamcinolone Discharge Instruction: Apply Triamcinolone as directed Primary Dressing KerraCel Ag Gelling Fiber Dressing, 4x5 in (silver alginate) Discharge Instruction: Apply silver alginate to wound bed as instructed Secondary Dressing ABD Pad, 8x10 Discharge Instruction: Apply over primary dressing as directed. Zetuvit Plus 4x8 in Discharge Instruction: Apply over primary dressing as directed. Secured With Compression Wrap ThreePress (3 layer compression wrap) Discharge Instruction: Apply three layer compression as directed. Stockinette Compression Stockings Environmental education officer) Signed: 06/01/2022 12:33:08 PM By: Sharyn Creamer RN, BSN Signed: 06/01/2022 6:44:27 PM By: Dellie Catholic RN Entered By: Sharyn Creamer on 06/01/2022 08:49:16 -------------------------------------------------------------------------------- Wound Assessment Details Patient Name: Date of Service: FINUS, YANKE [facility] T. 06/01/2022 8:00 A M Medical Record Number: [id number] Patient Account Number: [id number] Date of Birth/Sex: Treating RN: 1966-02-04 (56 y.o. [name] Primary Care Lavonta Tillis: Jenna Luo Other Clinician: Referring Elby Blackwelder: Treating Sedrick Tober/Extender: Claris Pong, GO V INDA Weeks in Treatment: 0 Wound Status Wound Number: 2 Primary Etiology: Vasculitis Wound Location: Right, Circumferential  Lower Leg Wound Status: Open Wounding Event: Gradually Appeared Comorbid History: Gout Date Acquired: 02/10/2022 Weeks Of Treatment: 0 Clustered Wound: Yes Photos Wound Measurements Length: (cm) 25 Width: (cm) 35 Depth: (cm) 0.2 Area: (cm) 687.223 Volume: (cm) 137.445 % Reduction in Area: 0% % Reduction in Volume: 0% Epithelialization: None Tunneling: No Undermining: No Wound Description Classification: Full Thickness Without Exposed Support Structu Exudate Amount: Large Exudate Type: Serous Exudate Color: amber res Wound Bed Granulation Amount: Small (1-33%) Exposed Structure Necrotic Amount: Large (67-100%) Fascia Exposed: No Necrotic Quality: Adherent Slough Fat Layer (Subcutaneous Tissue) Exposed: Yes Tendon Exposed: No Muscle Exposed: No Joint Exposed: No Bone Exposed: No Treatment Notes Wound #2 (Lower Leg) Wound Laterality: Right, Circumferential Cleanser Soap and Water Discharge Instruction: May shower and wash wound with dial antibacterial soap and water prior to dressing change. Wound Cleanser Discharge Instruction: Cleanse the wound with wound cleanser prior to applying a clean dressing using gauze sponges, not tissue or cotton balls. Peri-Wound Care Topical Triamcinolone Discharge Instruction: Apply Triamcinolone as directed Primary Dressing KerraCel Ag Gelling Fiber Dressing, 4x5 in (silver alginate) Discharge Instruction: Apply silver alginate to wound bed as instructed Secondary Dressing ABD Pad, 8x10 Discharge Instruction: Apply over primary dressing as directed. Zetuvit Plus 4x8 in Discharge Instruction: Apply over primary dressing as directed. Secured With Compression Wrap ThreePress (3 layer compression wrap) Discharge Instruction: Apply three layer compression as directed. Stockinette Compression Stockings Environmental education officer) Signed: 06/01/2022 12:33:08 PM By: Sharyn Creamer RN, BSN Signed: 06/01/2022 6:44:27 PM By:  Dellie Catholic RN Entered By: Sharyn Creamer on 06/01/2022 08:50:53 -------------------------------------------------------------------------------- Wound Assessment Details Patient Name: Date of Service: STONIE, CUMBERBATCH [facility] T. 06/01/2022 8:00 A M Medical Record Number: [id number] Patient Account Number: [id number] Date of Birth/Sex: Treating RN: 09-08-1966 (56 y.o. [name] Primary Care Iveliz Garay: Jenna Luo Other Clinician: Referring Sonora Catlin: Treating Dorma Altman/Extender: Claris Pong, GO V INDA Weeks in Treatment: 0 Wound Status Wound Number: 3 Primary Etiology: Vasculitis Wound Location: Right, Dorsal Foot Wound Status: Open Wounding Event: Gradually Appeared Comorbid History: Gout Date Acquired: 02/10/2022 Weeks Of Treatment: 0 Clustered Wound: Yes Photos Wound Measurements Length: (cm) 0.3 Width: (cm) 0.3 Depth: (cm) 0.1 Area: (cm) 0.071 Volume: (cm) 0.007 % Reduction in Area: 0% % Reduction in Volume: 0% Epithelialization: None Wound Description  Classification: Full Thickness Without Exposed Support Structures Exudate Amount: Large Exudate Type: Serous Exudate Color: amber Foul Odor After Cleansing: No Slough/Fibrino Yes Wound Bed Granulation Amount: Small (1-33%) Exposed Structure Granulation Quality: Red, Pink Fascia Exposed: No Necrotic Amount: Large (67-100%) Fat Layer (Subcutaneous Tissue) Exposed: Yes Necrotic Quality: Adherent Slough Tendon Exposed: No Muscle Exposed: No Joint Exposed: No Bone Exposed: No Treatment Notes Wound #3 (Foot) Wound Laterality: Dorsal, Right Cleanser Soap and Water Discharge Instruction: May shower and wash wound with dial antibacterial soap and water prior to dressing change. Wound Cleanser Discharge Instruction: Cleanse the wound with wound cleanser prior to applying a clean dressing using gauze sponges, not tissue or cotton balls. Peri-Wound Care Topical Triamcinolone Discharge Instruction:  Apply Triamcinolone as directed Primary Dressing KerraCel Ag Gelling Fiber Dressing, 4x5 in (silver alginate) Discharge Instruction: Apply silver alginate to wound bed as instructed Secondary Dressing ABD Pad, 8x10 Discharge Instruction: Apply over primary dressing as directed. Zetuvit Plus 4x8 in Discharge Instruction: Apply over primary dressing as directed. Secured With Compression Wrap ThreePress (3 layer compression wrap) Discharge Instruction: Apply three layer compression as directed. Stockinette Compression Stockings Environmental education officer) Signed: 06/01/2022 12:33:08 PM By: Sharyn Creamer RN, BSN Signed: 06/01/2022 6:44:27 PM By: Dellie Catholic RN Entered By: Sharyn Creamer on 06/01/2022 08:51:46 -------------------------------------------------------------------------------- Wound Assessment Details Patient Name: Date of Service: AUN, VERRICO [facility] T. 06/01/2022 8:00 A M Medical Record Number: [id number] Patient Account Number: [id number] Date of Birth/Sex: Treating RN: Mar 05, 1966 (56 y.o. [name] Primary Care Hallel Denherder: Jenna Luo Other Clinician: Referring Jessie Schrieber: Treating Reece Mcbroom/Extender: Claris Pong, GO V INDA Weeks in Treatment: 0 Wound Status Wound Number: 4 Primary Etiology: Vasculitis Wound Location: Left, Dorsal Foot Wound Status: Open Wounding Event: Gradually Appeared Comorbid History: Gout Date Acquired: 02/10/2022 Weeks Of Treatment: 0 Clustered Wound: Yes Photos Wound Measurements Length: (cm) 0.3 Width: (cm) 0.3 Depth: (cm) 0.2 Area: (cm) 0.071 Volume: (cm) 0.014 % Reduction in Area: 0% % Reduction in Volume: 0% Epithelialization: None Tunneling: No Undermining: No Wound Description Classification: Full Thickness Without Exposed Support Structu Exudate Amount: Large Exudate Type: Serous Exudate Color: amber res Wound Bed Granulation Amount: Small (1-33%) Exposed Structure Granulation Quality:  Pink Fascia Exposed: No Necrotic Amount: Large (67-100%) Fat Layer (Subcutaneous Tissue) Exposed: Yes Necrotic Quality: Adherent Slough Tendon Exposed: No Muscle Exposed: No Joint Exposed: No Bone Exposed: No Treatment Notes Wound #4 (Foot) Wound Laterality: Dorsal, Left Cleanser Soap and Water Discharge Instruction: May shower and wash wound with dial antibacterial soap and water prior to dressing change. Wound Cleanser Discharge Instruction: Cleanse the wound with wound cleanser prior to applying a clean dressing using gauze sponges, not tissue or cotton balls. Peri-Wound Care Topical Triamcinolone Discharge Instruction: Apply Triamcinolone as directed Primary Dressing KerraCel Ag Gelling Fiber Dressing, 4x5 in (silver alginate) Discharge Instruction: Apply silver alginate to wound bed as instructed Secondary Dressing ABD Pad, 8x10 Discharge Instruction: Apply over primary dressing as directed. Zetuvit Plus 4x8 in Discharge Instruction: Apply over primary dressing as directed. Secured With Compression Wrap ThreePress (3 layer compression wrap) Discharge Instruction: Apply three layer compression as directed. Stockinette Compression Stockings Environmental education officer) Signed: 06/01/2022 12:33:08 PM By: Sharyn Creamer RN, BSN Signed: 06/01/2022 6:44:27 PM By: Dellie Catholic RN Entered By: Sharyn Creamer on 06/01/2022 08:52:34 -------------------------------------------------------------------------------- Vitals Details Patient Name: Date of Service: OTHIE, PICKAR [facility] T. 06/01/2022 8:00 A M Medical Record Number: [id number] Patient Account Number: [id number] Date of Birth/Sex: Treating RN: 12/29/1965 (56 y.o. Jerilynn Mages) Dellie Catholic  Primary Care Sharell Hilmer: Lynnea Ferrier Other Clinician: Referring Mayling Aber: Treating Ceana Fiala/Extender: Farrel Conners, GO V INDA Weeks in Treatment: 0 Vital Signs Time Taken: 08:10 Temperature (F): 99.3 Height (in):  75 Pulse (bpm): 98 Source: Stated Respiratory Rate (breaths/min): 20 Weight (lbs): 325 Blood Pressure (mmHg): 146/95 Source: Stated Reference Range: 80 - 120 mg / dl Body Mass Index (BMI): 40.6 Electronic Signature(s) Signed: 06/01/2022 6:44:27 PM By: Karie Schwalbe RN Entered By: Karie Schwalbe on 06/01/2022 08:13:26

## 2022-06-04 ENCOUNTER — Encounter (HOSPITAL_BASED_OUTPATIENT_CLINIC_OR_DEPARTMENT_OTHER): Payer: BC Managed Care – PPO | Admitting: General Surgery

## 2022-06-04 ENCOUNTER — Telehealth: Payer: Self-pay

## 2022-06-04 DIAGNOSIS — L97828 Non-pressure chronic ulcer of other part of left lower leg with other specified severity: Secondary | ICD-10-CM | POA: Diagnosis not present

## 2022-06-04 NOTE — Telephone Encounter (Signed)
Pt called in stating that he was seen at the wound clinic today and had dressing changed and is now in extreme pain. Pt would like to know if pcp will send in something stronger for pain please. Please advise.  Cb#: [phone]

## 2022-06-04 NOTE — Telephone Encounter (Signed)
Per Dr. Tanya Nones, that pt can increase his dose by taking 2tabs vs 1 as needed for pain. Pt voiced understanding.

## 2022-06-04 NOTE — Progress Notes (Signed)
Walter [name] ([id number]) , . Visit Report for 06/04/2022 Arrival Information Details Patient Name: Date of Service: SHIELDS, PAUTZ [facility] T. 06/04/2022 7:30 A M Medical Record Number: [id number] Patient Account Number: [id number] Date of Birth/Sex: Treating RN: 07/29/1966 (56 y.o. Walter [name] Primary Care Shizuye Rupert: Lynnea Ferrier Other Clinician: Referring Sina Sumpter: Treating Madox Corkins/Extender: Suzan Garibaldi in Treatment: 0 Visit Information History Since Last Visit Added or deleted any medications: No Patient Arrived: Ambulatory Any new allergies or adverse reactions: No Arrival Time: 07:36 Had a fall or experienced change in No Accompanied By: spouse activities of daily living that may affect Transfer Assistance: None risk of falls: Patient Identification Verified: Yes Signs or symptoms of abuse/neglect since last visito No Hospitalized since last visit: No Implantable device outside of the clinic excluding No cellular tissue based products placed in the center since last visit: Has Dressing in Place as Prescribed: Yes Has Compression in Place as Prescribed: Yes Pain Present Now: Yes Electronic Signature(s) Signed: 06/04/2022 10:09:05 AM By: Karie Schwalbe RN Entered By: Karie Schwalbe on 06/04/2022 08:02:45 -------------------------------------------------------------------------------- Clinic Level of Care Assessment Details Patient Name: Date of Service: EBRIMA, RANTA [facility] T. 06/04/2022 7:30 A M Medical Record Number: [id number] Patient Account Number: [id number] Date of Birth/Sex: Treating RN: Mar 21, 1966 (56 y.o. Walter [name] Primary Care Beretta Ginsberg: Lynnea Ferrier Other Clinician: Referring Ziva Nunziata: Treating Rogue Rafalski/Extender: Suzan Garibaldi in Treatment: 0 Clinic Level of Care Assessment Items TOOL 1 Quantity Score X- 1 0 Use when EandM and Procedure is performed on INITIAL visit ASSESSMENTS - Nursing  Assessment / Reassessment X- 1 20 General Physical Exam (combine w/ comprehensive assessment (listed just below) when performed on new pt. evals) X- 1 25 Comprehensive Assessment (HX, ROS, Risk Assessments, Wounds Hx, etc.) ASSESSMENTS - Wound and Skin Assessment / Reassessment X- 1 10 Dermatologic / Skin Assessment (not related to wound area) ASSESSMENTS - Ostomy and/or Continence Assessment and Care []  - 0 Incontinence Assessment and Management []  - 0 Ostomy Care Assessment and Management (repouching, etc.) PROCESS - Coordination of Care []  - 0 Simple Patient / Family Education for ongoing care X- 1 20 Complex (extensive) Patient / Family Education for ongoing care X- 1 10 Staff obtains , Records, T Results / Process Orders est X- 1 10 Staff telephones HHA, Nursing Homes / Clarify orders / etc []  - 0 Routine Transfer to another Facility (non-emergent condition) []  - 0 Routine Hospital Admission (non-emergent condition) []  - 0 New Admissions / / Ordering NPWT Apligraf, etc. , []  - 0 Emergency Hospital Admission (emergent condition) PROCESS - Special Needs []  - 0 Pediatric / Minor Patient Management []  - 0 Isolation Patient Management []  - 0 Hearing / Language / Visual special needs []  - 0 Assessment of Community assistance (transportation, D/C planning, etc.) []  - 0 Additional assistance / Altered mentation []  - 0 Support Surface(s) Assessment (bed, cushion, seat, etc.) INTERVENTIONS - Miscellaneous []  - 0 External ear exam []  - 0 Patient Transfer (multiple staff / / Similar devices) []  - 0 Simple Staple / Suture removal (25 or less) []  - 0 Complex Staple / Suture removal (26 or more) []  - 0 Hypo/Hyperglycemic Management (do not check if billed separately) []  - 0 Ankle / Brachial Index (ABI) - do not check if billed separately Has the patient been seen at the hospital within the last three years: Yes Total Score:  95 Level Of Care: New/Established - Level 3 Electronic Signature(s) Signed: 06/04/2022 10:09:05  AM By: Karie Schwalbe RN Entered By: Karie Schwalbe on 06/04/2022 08:54:25 -------------------------------------------------------------------------------- Compression Therapy Details Patient Name: Date of Service: [name], [name] [name] T. 06/04/2022 7:30 A M Medical Record Number: [id number] Patient Account Number: [id number] Date of Birth/Sex: Treating RN: 09-18-1966 (56 y.o. Walter [name] Primary Care Rosann Gorum: Lynnea Ferrier Other Clinician: Referring Anees Vanecek: Treating Ayaana Biondo/Extender: Suzan Garibaldi in Treatment: 0 Compression Therapy Performed for Wound Assessment: Wound #1 Left,Circumferential Lower Leg Performed By: Clinician Karie Schwalbe, RN Compression Type: Three Layer Electronic Signature(s) Signed: 06/04/2022 10:09:05 AM By: Karie Schwalbe RN Entered By: Karie Schwalbe on 06/04/2022 08:06:14 -------------------------------------------------------------------------------- Compression Therapy Details Patient Name: Date of Service: [name], [name] [facility] T. 06/04/2022 7:30 A M Medical Record Number: [id number] Patient Account Number: [id number] Date of Birth/Sex: Treating RN: Feb 03, 1966 (56 y.o. Walter [name] Primary Care Amparo Donalson: Lynnea Ferrier Other Clinician: Referring Fremont Skalicky: Treating Celestia Duva/Extender: Suzan Garibaldi in Treatment: 0 Compression Therapy Performed for Wound Assessment: Wound #2 Right,Circumferential Lower Leg Performed By: Clinician Karie Schwalbe, RN Compression Type: Three Layer Electronic Signature(s) Signed: 06/04/2022 10:09:05 AM By: Karie Schwalbe RN Entered By: Karie Schwalbe on 06/04/2022 08:06:14 -------------------------------------------------------------------------------- Compression Therapy Details Patient Name: Date of Service: [name], [name] [facility] T. 06/04/2022 7:30 A M Medical  Record Number: [id number] Patient Account Number: [id number] Date of Birth/Sex: Treating RN: 12/18/1965 (56 y.o. Walter [name] Primary Care Criselda Starke: Lynnea Ferrier Other Clinician: Referring Denine Brotz: Treating Ariyonna Twichell/Extender: Suzan Garibaldi in Treatment: 0 Compression Therapy Performed for Wound Assessment: Wound #3 Right,Dorsal Foot Performed By: Clinician Karie Schwalbe, RN Compression Type: Three Layer Electronic Signature(s) Signed: 06/04/2022 10:09:05 AM By: Karie Schwalbe RN Entered By: Karie Schwalbe on 06/04/2022 08:06:14 -------------------------------------------------------------------------------- Compression Therapy Details Patient Name: Date of Service: MEGAN, HAYDUK [facility] T. 06/04/2022 7:30 A M Medical Record Number: [id number] Patient Account Number: [id number] Date of Birth/Sex: Treating RN: 05/18/1966 (56 y.o. Walter [name] Primary Care Hairo Garraway: Lynnea Ferrier Other Clinician: Referring Mahamed Zalewski: Treating Brittyn Salaz/Extender: Suzan Garibaldi in Treatment: 0 Compression Therapy Performed for Wound Assessment: Wound #4 Left,Dorsal Foot Performed By: Clinician Karie Schwalbe, RN Compression Type: Three Layer Electronic Signature(s) Signed: 06/04/2022 10:09:05 AM By: Karie Schwalbe RN Entered By: Karie Schwalbe on 06/04/2022 08:06:14 -------------------------------------------------------------------------------- Encounter Discharge Information Details Patient Name: Date of Service: DJUAN, TALTON [facility] T. 06/04/2022 7:30 A M Medical Record Number: [id number] Patient Account Number: [id number] Date of Birth/Sex: Treating RN: 30-Jun-1966 (56 y.o. Walter [name] Primary Care Rayah Fines: Lynnea Ferrier Other Clinician: Referring Trevian Hayashida: Treating Caytlin Better/Extender: Suzan Garibaldi in Treatment: 0 Encounter Discharge Information Items Discharge Condition: Stable Ambulatory  Status: Ambulatory Discharge Destination: Home Transportation: Private Auto Accompanied By: spouse Schedule Follow-up Appointment: Yes Clinical Summary of Care: Patient Declined Electronic Signature(s) Signed: 06/04/2022 10:09:05 AM By: Karie Schwalbe RN Entered By: Karie Schwalbe on 06/04/2022 08:55:41 -------------------------------------------------------------------------------- Patient/Caregiver Education Details Patient Name: Date of Service: JORAH, HUA [facility] T. 7/17/2023andnbsp7:30 A M Medical Record Number: [id number] Patient Account Number: [id number] Date of Birth/Gender: Treating RN: 10-21-1966 (56 y.o. Walter [name] Primary Care Physician: Lynnea Ferrier Other Clinician: Referring Physician: Treating Physician/Extender: Suzan Garibaldi in Treatment: 0 Education Assessment Education Provided To: Patient Education Topics Provided Wound/Skin Impairment: Methods: Explain/Verbal Responses: Return demonstration correctly Electronic Signature(s) Signed: 06/04/2022 10:09:05 AM By: Karie Schwalbe RN Entered By: Karie Schwalbe on 06/04/2022 08:54:57 -------------------------------------------------------------------------------- Wound Assessment Details Patient Name: Date of Service: DAEL, HOWLAND [facility] T. 06/04/2022 7:30 A M Medical Record Number: [id number] Patient Account Number: [id number] Date  of Birth/Sex: Treating RN: 04-23-1966 (56 y.o. Walter [name] Primary Care Riker Collier: Other Clinician: Lynnea Ferrier Referring Joia Doyle: Treating Denessa Cavan/Extender: Suzan Garibaldi in Treatment: 0 Wound Status Wound Number: 1 Primary Etiology: Vasculitis Wound Location: Left, Circumferential Lower Leg Wound Status: Open Wounding Event: Gradually Appeared Comorbid History: Gout Date Acquired: 02/10/2022 Weeks Of Treatment: 0 Clustered Wound: Yes Wound Measurements Length: (cm) 25 Width: (cm) 35 Depth: (cm)  0.2 Area: (cm) 687.223 Volume: (cm) 137.445 % Reduction in Area: 0% % Reduction in Volume: 0% Epithelialization: None Tunneling: No Undermining: No Wound Description Classification: Full Thickness Without Exposed Support Structures Exudate Amount: Large Exudate Type: Serous Exudate Color: amber Foul Odor After Cleansing: No Slough/Fibrino Yes Wound Bed Granulation Amount: Small (1-33%) Exposed Structure Granulation Quality: Pink Fascia Exposed: No Necrotic Amount: Large (67-100%) Fat Layer (Subcutaneous Tissue) Exposed: Yes Necrotic Quality: Adherent Slough Tendon Exposed: No Muscle Exposed: No Joint Exposed: No Bone Exposed: No Treatment Notes Wound #1 (Lower Leg) Wound Laterality: Left, Circumferential Cleanser Soap and Water Discharge Instruction: May shower and wash wound with dial antibacterial soap and water prior to dressing change. Wound Cleanser Discharge Instruction: Cleanse the wound with wound cleanser prior to applying a clean dressing using gauze sponges, not tissue or cotton balls. Peri-Wound Care Topical Triamcinolone Discharge Instruction: Apply Triamcinolone as directed Primary Dressing KerraCel Ag Gelling Fiber Dressing, 4x5 in (silver alginate) Discharge Instruction: Apply silver alginate to wound bed as instructed Secondary Dressing ABD Pad, 8x10 Discharge Instruction: Apply over primary dressing as directed. Zetuvit Plus 4x8 in Discharge Instruction: Apply over primary dressing as directed. Secured With Compression Wrap ThreePress (3 layer compression wrap) Discharge Instruction: Apply three layer compression as directed. Stockinette Compression Stockings Facilities manager) Signed: 06/04/2022 10:09:05 AM By: Karie Schwalbe RN Entered By: Karie Schwalbe on 06/04/2022 08:04:22 -------------------------------------------------------------------------------- Wound Assessment Details Patient Name: Date of Service: [place], MCEVOY [facility] T. 06/04/2022 7:30 A M Medical Record Number: [id number] Patient Account Number: [id number] Date of Birth/Sex: Treating RN: December 21, 1965 (56 y.o. Walter [name] Primary Care Jendayi Berling: Lynnea Ferrier Other Clinician: Referring Brazos Sandoval: Treating Thaison Kolodziejski/Extender: Suzan Garibaldi in Treatment: 0 Wound Status Wound Number: 2 Primary Etiology: Vasculitis Wound Location: Right, Circumferential Lower Leg Wound Status: Open Wounding Event: Gradually Appeared Comorbid History: Gout Date Acquired: 02/10/2022 Weeks Of Treatment: 0 Clustered Wound: Yes Wound Measurements Length: (cm) 25 Width: (cm) 35 Depth: (cm) 0.2 Area: (cm) 687.223 Volume: (cm) 137.445 % Reduction in Area: 0% % Reduction in Volume: 0% Epithelialization: None Tunneling: No Undermining: No Wound Description Classification: Full Thickness Without Exposed Support Structu Exudate Amount: Large Exudate Type: Serous Exudate Color: amber res Wound Bed Granulation Amount: Small (1-33%) Exposed Structure Necrotic Amount: Large (67-100%) Fascia Exposed: No Necrotic Quality: Adherent Slough Fat Layer (Subcutaneous Tissue) Exposed: Yes Tendon Exposed: No Muscle Exposed: No Joint Exposed: No Bone Exposed: No Treatment Notes Wound #2 (Lower Leg) Wound Laterality: Right, Circumferential Cleanser Soap and Water Discharge Instruction: May shower and wash wound with dial antibacterial soap and water prior to dressing change. Wound Cleanser Discharge Instruction: Cleanse the wound with wound cleanser prior to applying a clean dressing using gauze sponges, not tissue or cotton balls. Peri-Wound Care Topical Triamcinolone Discharge Instruction: Apply Triamcinolone as directed Primary Dressing KerraCel Ag Gelling Fiber Dressing, 4x5 in (silver alginate) Discharge Instruction: Apply silver alginate to wound bed as instructed Secondary Dressing ABD Pad, 8x10 Discharge Instruction:  Apply over primary dressing as directed. Zetuvit Plus 4x8 in Discharge Instruction: Apply over primary dressing  as directed. Secured With Compression Wrap ThreePress (3 layer compression wrap) Discharge Instruction: Apply three layer compression as directed. Stockinette Compression Stockings Facilities manager) Signed: 06/04/2022 10:09:05 AM By: Karie Schwalbe RN Entered By: Karie Schwalbe on 06/04/2022 08:04:45 -------------------------------------------------------------------------------- Wound Assessment Details Patient Name: Date of Service: LAZLO, FLUHARTY [facility] T. 06/04/2022 7:30 A M Medical Record Number: [id number] Patient Account Number: [id number] Date of Birth/Sex: Treating RN: 03-22-1966 (56 y.o. Walter [name] Primary Care Christoher Drudge: Lynnea Ferrier Other Clinician: Referring Janis Sol: Treating Natnael Biederman/Extender: Suzan Garibaldi in Treatment: 0 Wound Status Wound Number: 3 Primary Etiology: Vasculitis Wound Location: Right, Dorsal Foot Wound Status: Open Wounding Event: Gradually Appeared Comorbid History: Gout Date Acquired: 02/10/2022 Weeks Of Treatment: 0 Clustered Wound: Yes Wound Measurements Length: (cm) 0.3 Width: (cm) 0.3 Depth: (cm) 0.1 Area: (cm) 0.071 Volume: (cm) 0.007 % Reduction in Area: 0% % Reduction in Volume: 0% Epithelialization: None Tunneling: No Undermining: No Wound Description Classification: Full Thickness Without Exposed Support Structures Exudate Amount: Large Exudate Type: Serous Exudate Color: amber Foul Odor After Cleansing: No Slough/Fibrino Yes Wound Bed Granulation Amount: Small (1-33%) Exposed Structure Granulation Quality: Red, Pink Fascia Exposed: No Necrotic Amount: Large (67-100%) Fat Layer (Subcutaneous Tissue) Exposed: Yes Necrotic Quality: Adherent Slough Tendon Exposed: No Muscle Exposed: No Joint Exposed: No Bone Exposed: No Treatment Notes Wound #3 (Foot) Wound  Laterality: Dorsal, Right Cleanser Soap and Water Discharge Instruction: May shower and wash wound with dial antibacterial soap and water prior to dressing change. Wound Cleanser Discharge Instruction: Cleanse the wound with wound cleanser prior to applying a clean dressing using gauze sponges, not tissue or cotton balls. Peri-Wound Care Topical Triamcinolone Discharge Instruction: Apply Triamcinolone as directed Primary Dressing KerraCel Ag Gelling Fiber Dressing, 4x5 in (silver alginate) Discharge Instruction: Apply silver alginate to wound bed as instructed Secondary Dressing ABD Pad, 8x10 Discharge Instruction: Apply over primary dressing as directed. Zetuvit Plus 4x8 in Discharge Instruction: Apply over primary dressing as directed. Secured With Compression Wrap ThreePress (3 layer compression wrap) Discharge Instruction: Apply three layer compression as directed. Stockinette Compression Stockings Facilities manager) Signed: 06/04/2022 10:09:05 AM By: Karie Schwalbe RN Entered By: Karie Schwalbe on 06/04/2022 08:05:13 -------------------------------------------------------------------------------- Wound Assessment Details Patient Name: Date of Service: BRIANNA, BERTOLET [facility] T. 06/04/2022 7:30 A M Medical Record Number: [id number] Patient Account Number: [id number] Date of Birth/Sex: Treating RN: 05/27/1966 (56 y.o. Walter [name] Primary Care Rehan Holness: Lynnea Ferrier Other Clinician: Referring Keeya Dyckman: Treating Brayah Urquilla/Extender: Suzan Garibaldi in Treatment: 0 Wound Status Wound Number: 4 Primary Etiology: Vasculitis Wound Location: Left, Dorsal Foot Wound Status: Open Wounding Event: Gradually Appeared Comorbid History: Gout Date Acquired: 02/10/2022 Weeks Of Treatment: 0 Clustered Wound: Yes Wound Measurements Length: (cm) 0.3 Width: (cm) 0.3 Depth: (cm) 0.2 Area: (cm) 0.071 Volume: (cm) 0.014 % Reduction in Area:  0% % Reduction in Volume: 0% Epithelialization: None Tunneling: No Undermining: No Wound Description Classification: Full Thickness Without Exposed Support Structu Exudate Amount: Large Exudate Type: Serous Exudate Color: amber res Wound Bed Granulation Amount: Small (1-33%) Exposed Structure Granulation Quality: Pink Fascia Exposed: No Necrotic Amount: Large (67-100%) Fat Layer (Subcutaneous Tissue) Exposed: Yes Necrotic Quality: Adherent Slough Tendon Exposed: No Muscle Exposed: No Joint Exposed: No Bone Exposed: No Treatment Notes Wound #4 (Foot) Wound Laterality: Dorsal, Left Cleanser Soap and Water Discharge Instruction: May shower and wash wound with dial antibacterial soap and water prior to dressing change. Wound Cleanser Discharge Instruction: Cleanse the wound with wound cleanser prior to applying  a clean dressing using gauze sponges, not tissue or cotton balls. Peri-Wound Care Topical Triamcinolone Discharge Instruction: Apply Triamcinolone as directed Primary Dressing KerraCel Ag Gelling Fiber Dressing, 4x5 in (silver alginate) Discharge Instruction: Apply silver alginate to wound bed as instructed Secondary Dressing ABD Pad, 8x10 Discharge Instruction: Apply over primary dressing as directed. Zetuvit Plus 4x8 in Discharge Instruction: Apply over primary dressing as directed. Secured With Compression Wrap ThreePress (3 layer compression wrap) Discharge Instruction: Apply three layer compression as directed. Stockinette Compression Stockings Environmental education officer) Signed: 06/04/2022 10:09:05 AM By: Dellie Catholic RN Entered By: Dellie Catholic on 06/04/2022 08:05:32 -------------------------------------------------------------------------------- Vitals Details Patient Name: Date of Service: KAYCEE, WINCKLER [facility] T. 06/04/2022 7:30 A M Medical Record Number: [id number] Patient Account Number: [id number] Date of Birth/Sex: Treating RN: Aug 16, 1966  (56 y.o. [name] Primary Care Christon Gallaway: Jenna Luo Other Clinician: Referring Nyima Vanacker: Treating Akil Hoos/Extender: Mertha Finders in Treatment: 0 Vital Signs Time Taken: 07:55 Temperature (F): 99.2 Height (in): 75 Pulse (bpm): 98 Weight (lbs): 325 Respiratory Rate (breaths/min): 20 Body Mass Index (BMI): 40.6 Blood Pressure (mmHg): 115/70 Reference Range: 80 - 120 mg / dl Electronic Signature(s) Signed: 06/04/2022 10:09:05 AM By: Dellie Catholic RN Entered By: Dellie Catholic on 06/04/2022 08:03:23

## 2022-06-04 NOTE — Progress Notes (Signed)
KEANTHONY POOLE ([id number]) , . Visit Report for 06/04/2022 SuperBill Details Patient Name: Date of Service: Walter [name], [facility] T. 06/04/2022 Medical Record Number: [id number] Patient Account Number: [id number] Date of Birth/Sex: Treating RN: Sep 26, 1966 (56 y.o. 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

## 2022-06-05 NOTE — Progress Notes (Signed)
Walter [name] ([id number]) , . Visit Report for 06/01/2022 Chief Complaint Document Details Patient Name: Date of Service: Walter [name], Walter [name] [facility] T. 06/01/2022 8:00 A M Medical Record Number: [id number] Patient Account Number: [id number] Date of Birth/Sex: Treating RN: 1966-05-06 (56 y.o. Dianna Limbo Primary Care Provider: Lynnea Ferrier Other Clinician: Referring Provider: Treating Provider/Extender: Princella Pellegrini in Treatment: 0 Information Obtained from: Patient Chief Complaint 06/01/2022; patient is here for review of predominantly wounds on his bilateral lower legs and also bilateral dorsal feet Electronic Signature(s) Signed: 06/01/2022 4:59:49 PM By: Baltazar Najjar MD Entered By: Baltazar Najjar on 06/01/2022 09:35:51 -------------------------------------------------------------------------------- HPI Details Patient Name: Date of Service: Walter [name] [facility] T. 06/01/2022 8:00 A M Medical Record Number: [id number] Patient Account Number: [id number] Date of Birth/Sex: Treating RN: 1966-01-17 (56 y.o. Dianna Limbo Primary Care Provider: Lynnea Ferrier Other Clinician: Referring Provider: Treating Provider/Extender: Princella Pellegrini in Treatment: 0 History of Present Illness HPI Description: ADMISSION 06/01/2022 This is a 56 year old man who was problem began towards the end of March. He developed acute onset of of worsening painful rash involving his bilateral legs but also both arms and his back. At that time he was originally seen in urgent care this have been going on for a week. He also had marked bilateral lower extremity edema. 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 He was started on prednisone 60 mg. Had extensive blood work and revealing a CRP of 90 but ANCA negative. Hepatitis screen  negative. He saw a rheumatology Dr. Deanne Coffer at [facility]. He has him on Lasix 40 and prednisone 40 although the patient tells me that this is coming to an end. 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 He describes pain at 10 out of 10 made worse by putting any pressure on the back of his legs. He has been sleeping in a chair with his legs down Historically he may have had background lymphedema in his legs although I am really not certain about this. Past medical history includes a history of COPD/hypoxia. He is not a diabetic.He is a non-smoker Psychologist, prison and probation services) Signed: 06/01/2022 4:59:49 PM By: Baltazar Najjar MD Entered By: Baltazar Najjar on 06/01/2022 09:41:53 -------------------------------------------------------------------------------- Physical Exam Details Patient Name: Date of Service: Walter [name], Walter [name] [facility] T. 06/01/2022 8:00 A M Medical Record Number: [id number] Patient Account Number: [id number] Date of Birth/Sex: Treating RN: October 26, 1966 (56 y.o. Dianna Limbo Primary Care Provider: Lynnea Ferrier Other Clinician: Referring Provider: Treating Provider/Extender: Princella Pellegrini in Treatment: 0 Constitutional Patient is hypertensive.. Pulse regular and within target range for patient.Marland Kitchen Respirations regular, non-labored and within target range.. Patient is febrile today.. The patient is not in any distress. Morbid obesity. Respiratory No respiratory distress. Cardiovascular Heart sounds are distant. JVP is not visible. In spite of the edema in his dorsalis pedis and posterior tibial pulses are palpable bilaterally. Edema present in both extremities.Nonpitting 3-4+. Integumentary (Hair, Skin) No widespread skin rash.. Notes Wound exam; the patient has substantial wounds on the right greater than left posterior calf. Slough covered. Massive nonpitting edema in both lower legs. Small superficial areas on his dorsal  feet bilaterally. There is no obvious cellulitis here Also noted these he has a blotchy rash on the right greater than left lower extremity just below both knees suggesting the activity of his vasculitis  is probably still present. He has erythema in both legs but I do not think this represents cellulitis Electronic Signature(s) Signed: 06/01/2022 4:59:49 PM By: Baltazar Najjar MD Entered By: Baltazar Najjar on 06/01/2022 09:44:21 -------------------------------------------------------------------------------- Physician Orders Details Patient Name: Date of Service: Walter [name], Walter [name] [facility] T. 06/01/2022 8:00 A M Medical Record Number: [id number] Patient Account Number: [id number] Date of Birth/Sex: Treating RN: [birth year] (56 y.o. Dianna Limbo Primary Care Provider: Lynnea Ferrier Other Clinician: Referring Provider: Treating Provider/Extender: Princella Pellegrini in Treatment: 0 Verbal / Phone Orders: No Diagnosis Coding Follow-up Appointments ppointment in 1 week. - Dr. Lady Gary Room 3/4 Thursday 7/20 at 2pm Return A Nurse Visit: - Dr Lady Gary Room 3/4 Monday 7/17 at 7:30am Bathing/ Shower/ Hygiene Other Bathing/Shower/Hygiene Orders/Instructions: - Please keep leg wraps dry-sponge bathe for now Edema Control - Lymphedema / SCD / Other Bilateral Lower Extremities Elevate legs to the level of the heart or above for 30 minutes daily and/or when sitting, a frequency of: - As much as possible-Elevate throughout the day. void standing for long periods of time. - Frequently throughout the day A Wound Treatment Wound #1 - Lower Leg Wound Laterality: Left, Circumferential Cleanser: Soap and Water 1 x Per Week/30 Days Discharge Instructions: May shower and wash wound with dial antibacterial soap and water prior to dressing change. Cleanser: Wound Cleanser 1 x Per Week/30 Days Discharge Instructions: Cleanse the wound with wound cleanser prior to applying a clean dressing using  gauze sponges, not tissue or cotton balls. Topical: Triamcinolone 1 x Per Week/30 Days Discharge Instructions: Apply Triamcinolone as directed Prim Dressing: KerraCel Ag Gelling Fiber Dressing, 4x5 in (silver alginate) 1 x Per Week/30 Days ary Discharge Instructions: Apply silver alginate to wound bed as instructed Secondary Dressing: ABD Pad, 8x10 1 x Per Week/30 Days Discharge Instructions: Apply over primary dressing as directed. Secondary Dressing: Zetuvit Plus 4x8 in 1 x Per Week/30 Days Discharge Instructions: Apply over primary dressing as directed. Compression Wrap: ThreePress (3 layer compression wrap) 1 x Per Week/30 Days Discharge Instructions: Apply three layer compression as directed. Compression Wrap: Stockinette 1 x Per Week/30 Days Wound #2 - Lower Leg Wound Laterality: Right, Circumferential Cleanser: Soap and Water 1 x Per Week/30 Days Discharge Instructions: May shower and wash wound with dial antibacterial soap and water prior to dressing change. Cleanser: Wound Cleanser 1 x Per Week/30 Days Discharge Instructions: Cleanse the wound with wound cleanser prior to applying a clean dressing using gauze sponges, not tissue or cotton balls. Topical: Triamcinolone 1 x Per Week/30 Days Discharge Instructions: Apply Triamcinolone as directed Prim Dressing: KerraCel Ag Gelling Fiber Dressing, 4x5 in (silver alginate) 1 x Per Week/30 Days ary Discharge Instructions: Apply silver alginate to wound bed as instructed Secondary Dressing: ABD Pad, 8x10 1 x Per Week/30 Days Discharge Instructions: Apply over primary dressing as directed. Secondary Dressing: Zetuvit Plus 4x8 in 1 x Per Week/30 Days Discharge Instructions: Apply over primary dressing as directed. Compression Wrap: ThreePress (3 layer compression wrap) 1 x Per Week/30 Days Discharge Instructions: Apply three layer compression as directed. Compression Wrap: Stockinette 1 x Per Week/30 Days Wound #3 - Foot Wound  Laterality: Dorsal, Right Cleanser: Soap and Water 1 x Per Week/30 Days Discharge Instructions: May shower and wash wound with dial antibacterial soap and water prior to dressing change. Cleanser: Wound Cleanser 1 x Per Week/30 Days Discharge Instructions: Cleanse the wound with wound cleanser prior to applying a clean dressing using gauze sponges, not tissue or cotton balls.  Topical: Triamcinolone 1 x Per Week/30 Days Discharge Instructions: Apply Triamcinolone as directed Prim Dressing: KerraCel Ag Gelling Fiber Dressing, 4x5 in (silver alginate) 1 x Per Week/30 Days ary Discharge Instructions: Apply silver alginate to wound bed as instructed Secondary Dressing: ABD Pad, 8x10 1 x Per Week/30 Days Discharge Instructions: Apply over primary dressing as directed. Secondary Dressing: Zetuvit Plus 4x8 in 1 x Per Week/30 Days Discharge Instructions: Apply over primary dressing as directed. Compression Wrap: ThreePress (3 layer compression wrap) 1 x Per Week/30 Days Discharge Instructions: Apply three layer compression as directed. Compression Wrap: Stockinette 1 x Per Week/30 Days Wound #4 - Foot Wound Laterality: Dorsal, Left Cleanser: Soap and Water 1 x Per Week/30 Days Discharge Instructions: May shower and wash wound with dial antibacterial soap and water prior to dressing change. Cleanser: Wound Cleanser 1 x Per Week/30 Days Discharge Instructions: Cleanse the wound with wound cleanser prior to applying a clean dressing using gauze sponges, not tissue or cotton balls. Topical: Triamcinolone 1 x Per Week/30 Days Discharge Instructions: Apply Triamcinolone as directed Prim Dressing: KerraCel Ag Gelling Fiber Dressing, 4x5 in (silver alginate) 1 x Per Week/30 Days ary Discharge Instructions: Apply silver alginate to wound bed as instructed Secondary Dressing: ABD Pad, 8x10 1 x Per Week/30 Days Discharge Instructions: Apply over primary dressing as directed. Secondary Dressing: Zetuvit Plus  4x8 in 1 x Per Week/30 Days Discharge Instructions: Apply over primary dressing as directed. Compression Wrap: ThreePress (3 layer compression wrap) 1 x Per Week/30 Days Discharge Instructions: Apply three layer compression as directed. Compression Wrap: Stockinette 1 x Per Week/30 Days Electronic Signature(s) Signed: 06/01/2022 4:59:49 PM By: Baltazar Najjar MD Signed: 06/01/2022 6:44:27 PM By: Karie Schwalbe RN Entered By: Karie Schwalbe on 06/01/2022 09:40:38 -------------------------------------------------------------------------------- Problem List Details Patient Name: Date of Service: Walter [name], Walter [name] [facility] T. 06/01/2022 8:00 A M Medical Record Number: [id number] Patient Account Number: [id number] Date of Birth/Sex: Treating RN: Apr 24, 1966 (56 y.o. 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 06/01/2022 8:00 A M Medical Record Number: [id number] Patient Account  Number: [id number] Date of Birth/Sex: Treating RN: 01-Aug-1966 (56 y.o. Dianna Limbo Primary Care Provider: Lynnea Ferrier Other Clinician: Referring Provider: Treating Provider/Extender: Princella Pellegrini in Treatment: 0 Subjective Chief Complaint Information obtained from Patient 06/01/2022; patient is here for review of predominantly wounds on his bilateral lower legs and also bilateral dorsal feet History of Present Illness (HPI) ADMISSION 06/01/2022 This is a 56 year old man who was problem began towards the end of March. He developed acute onset of of worsening painful rash involving his bilateral legs but also both arms and his back. At that time he was originally seen in urgent care this  have been going on for a week. He also had marked bilateral lower extremity edema. 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 He was started on prednisone 60 mg. Had extensive blood work and revealing a CRP of 90 but ANCA negative. Hepatitis screen negative. He saw a rheumatology Dr. Deanne Coffer at [facility]. He has him on Lasix 40 and prednisone 40 although the patient tells me that this is coming to an end. 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 He describes pain at 10 out of 10 made worse by putting any pressure on the back of his legs. He has been sleeping in a chair with his legs down Historically he may have had background lymphedema in his legs although I am really not certain about this. Past medical history includes a history of COPD/hypoxia. He is not a diabetic.He is a non-smoker Patient History Information obtained from Patient. Allergies No Known Allergies Family History Unknown History. Social History Never smoker, Marital Status - Married, Alcohol Use - Never, Drug Use - No History,  Caffeine Use - Daily. Medical History Musculoskeletal Patient has history of Gout Medical A Surgical History Notes nd Endocrine Prediabetes Genitourinary UJ:[id number] infections Review of Systems (ROS) Constitutional Symptoms (General Health) Denies complaints or symptoms of Fatigue, Fever, Chills, Marked Weight Change. Eyes Denies complaints or symptoms of Dry Eyes, Vision Changes, Glasses / Contacts. Ear/Nose/Mouth/Throat Denies complaints or symptoms of Chronic sinus problems or rhinitis. Respiratory Denies complaints or symptoms of Chronic or frequent coughs, Shortness of Breath. Cardiovascular Denies complaints or symptoms of Chest pain. Gastrointestinal Denies complaints or symptoms of Frequent diarrhea, Nausea, Vomiting. Endocrine Denies complaints or symptoms of Heat/cold intolerance. Integumentary (Skin) Complains or has symptoms of Wounds - Vasculitis on Left Lower leg. Musculoskeletal Denies complaints or symptoms of Muscle Pain, Muscle Weakness. Neurologic Denies complaints or symptoms of Numbness/parasthesias. Psychiatric Denies complaints or symptoms of Claustrophobia, Suicidal. Objective Constitutional Patient is hypertensive.. Pulse regular and within target range for patient.Marland Kitchen Respirations regular, non-labored and within target range.. Patient is febrile today.. The patient is not in any distress. Morbid obesity. Vitals Time Taken: 8:10 AM, Height: 75 in, Source: Stated, Weight: 325 lbs, Source: Stated, BMI: 40.6, Temperature: 99.3 F, Pulse: 98 bpm, Respiratory Rate: 20 breaths/min, Blood Pressure: 146/95 mmHg. Respiratory No respiratory distress. Cardiovascular Heart sounds are distant. JVP is not visible. In spite of the edema in his dorsalis pedis and posterior tibial pulses are palpable bilaterally. Edema present in both extremities.Nonpitting 3-4+. General Notes: Wound exam; the patient has substantial wounds on the right greater than left posterior  calf. Slough covered. Massive nonpitting edema in both lower legs. Small superficial areas on his dorsal feet bilaterally. There is no obvious cellulitis here Also noted these he has a blotchy rash on the right greater than left lower extremity just below both knees suggesting the activity of his vasculitis is probably still present. He has erythema in both legs but I do not think this represents cellulitis Integumentary (Hair, Skin) No widespread skin rash.. Wound #1 status is Open. Original cause of wound was Gradually Appeared. The date acquired was: 02/10/2022. The wound is located on the Left,Circumferential Lower Leg. The wound measures 25cm length x 35cm width x 0.2cm depth; 687.223cm^2 area and 137.445cm^3 volume. There is Fat Layer (Subcutaneous Tissue) exposed. There is no tunneling or undermining noted. There is a large  amount of serous drainage noted. There is small (1-33%) pink granulation within the wound bed. There is a large (67-100%) amount of necrotic tissue within the wound bed including Adherent Slough. Wound #2 status is Open. Original cause of wound was Gradually Appeared. The date acquired was: 02/10/2022. The wound is located on the Right,Circumferential Lower Leg. The wound measures 25cm length x 35cm width x 0.2cm depth; 687.223cm^2 area and 137.445cm^3 volume. There is Fat Layer (Subcutaneous Tissue) exposed. There is no tunneling or undermining noted. There is a large amount of serous drainage noted. There is small (1-33%) granulation within the wound bed. There is a large (67-100%) amount of necrotic tissue within the wound bed including Adherent Slough. Wound #3 status is Open. Original cause of wound was Gradually Appeared. The date acquired was: 02/10/2022. The wound is located on the Right,Dorsal Foot. The wound measures 0.3cm length x 0.3cm width x 0.1cm depth; 0.071cm^2 area and 0.007cm^3 volume. There is Fat Layer (Subcutaneous Tissue) exposed. There is a large amount  of serous drainage noted. There is small (1-33%) red, pink granulation within the wound bed. There is a large (67-100%) amount of necrotic tissue within the wound bed including Adherent Slough. Wound #4 status is Open. Original cause of wound was Gradually Appeared. The date acquired was: 02/10/2022. The wound is located on the Left,Dorsal Foot. The wound measures 0.3cm length x 0.3cm width x 0.2cm depth; 0.071cm^2 area and 0.014cm^3 volume. There is Fat Layer (Subcutaneous Tissue) exposed. There is no tunneling or undermining noted. There is a large amount of serous drainage noted. There is small (1-33%) pink granulation within the wound bed. There is a large (67-100%) amount of necrotic tissue within the wound bed including Adherent Slough. 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 There was a Three Layer Compression Therapy Procedure by Karie Schwalbe, RN. Post procedure Diagnosis Wound #1: Same as Pre-Procedure Wound #2 Pre-procedure diagnosis of Wound #2 is a Vasculitis located on the Right,Circumferential Lower Leg . There was a Three Layer Compression Therapy Procedure by Karie Schwalbe, RN. Post procedure Diagnosis Wound #2: Same as Pre-Procedure Wound #3 Pre-procedure diagnosis of Wound #3 is a Vasculitis located on the Right,Dorsal Foot . There was a Three Layer Compression Therapy Procedure by Karie Schwalbe, RN. Post procedure Diagnosis Wound #3: Same as Pre-Procedure Wound  #4 Pre-procedure diagnosis of Wound #4 is a Vasculitis located on the Left,Dorsal Foot . There was a Three Layer Compression Therapy Procedure by Karie Schwalbe, RN. Post procedure Diagnosis Wound #4: Same as Pre-Procedure Plan Follow-up Appointments: Return Appointment in 1 week. - Dr. Lady Gary Room 3/4 Thursday 7/20 at 2pm Nurse Visit: - Dr Lady Gary Room 3/4 Monday 7/17 at 7:30am Bathing/ Shower/ Hygiene: Other Bathing/Shower/Hygiene Orders/Instructions: - Please keep leg wraps dry-sponge bathe for now Edema Control - Lymphedema / SCD / Other: Elevate legs to the level of the heart or above for 30 minutes daily and/or when sitting, a frequency of: - As much as possible-Elevate throughout the day. Avoid standing for long periods of time. - Frequently throughout the day WOUND #  1: - Lower Leg Wound Laterality: Left, Circumferential Cleanser: Soap and Water 1 x Per Week/30 Days Discharge Instructions: May shower and wash wound with dial antibacterial soap and water prior to dressing change. Cleanser: Wound Cleanser 1 x Per Week/30 Days Discharge Instructions: Cleanse the wound with wound cleanser prior to applying a clean dressing using gauze sponges, not tissue or cotton balls. Topical: Triamcinolone 1 x Per Week/30 Days Discharge Instructions: Apply Triamcinolone as directed Prim Dressing: KerraCel Ag Gelling Fiber Dressing, 4x5 in (silver alginate) 1 x Per Week/30 Days ary Discharge Instructions: Apply silver alginate to wound bed as instructed Secondary Dressing: ABD Pad, 8x10 1 x Per Week/30 Days Discharge Instructions: Apply over primary dressing as directed. Secondary Dressing: Zetuvit Plus 4x8 in 1 x Per Week/30 Days Discharge Instructions: Apply over primary dressing as directed. Com pression Wrap: ThreePress (3 layer compression wrap) 1 x Per Week/30 Days Discharge Instructions: Apply three layer compression as directed. Com pression Wrap: Stockinette 1 x Per Week/30 Days WOUND #2:  - Lower Leg Wound Laterality: Right, Circumferential Cleanser: Soap and Water 1 x Per Week/30 Days Discharge Instructions: May shower and wash wound with dial antibacterial soap and water prior to dressing change. Cleanser: Wound Cleanser 1 x Per Week/30 Days Discharge Instructions: Cleanse the wound with wound cleanser prior to applying a clean dressing using gauze sponges, not tissue or cotton balls. Topical: Triamcinolone 1 x Per Week/30 Days Discharge Instructions: Apply Triamcinolone as directed Prim Dressing: KerraCel Ag Gelling Fiber Dressing, 4x5 in (silver alginate) 1 x Per Week/30 Days ary Discharge Instructions: Apply silver alginate to wound bed as instructed Secondary Dressing: ABD Pad, 8x10 1 x Per Week/30 Days Discharge Instructions: Apply over primary dressing as directed. Secondary Dressing: Zetuvit Plus 4x8 in 1 x Per Week/30 Days Discharge Instructions: Apply over primary dressing as directed. Com pression Wrap: ThreePress (3 layer compression wrap) 1 x Per Week/30 Days Discharge Instructions: Apply three layer compression as directed. Com pression Wrap: Stockinette 1 x Per Week/30 Days WOUND #3: - Foot Wound Laterality: Dorsal, Right Cleanser: Soap and Water 1 x Per Week/30 Days Discharge Instructions: May shower and wash wound with dial antibacterial soap and water prior to dressing change. Cleanser: Wound Cleanser 1 x Per Week/30 Days Discharge Instructions: Cleanse the wound with wound cleanser prior to applying a clean dressing using gauze sponges, not tissue or cotton balls. Topical: Triamcinolone 1 x Per Week/30 Days Discharge Instructions: Apply Triamcinolone as directed Prim Dressing: KerraCel Ag Gelling Fiber Dressing, 4x5 in (silver alginate) 1 x Per Week/30 Days ary Discharge Instructions: Apply silver alginate to wound bed as instructed Secondary Dressing: ABD Pad, 8x10 1 x Per Week/30 Days Discharge Instructions: Apply over primary dressing as  directed. Secondary Dressing: Zetuvit Plus 4x8 in 1 x Per Week/30 Days Discharge Instructions: Apply over primary dressing as directed. Com pression Wrap: ThreePress (3 layer compression wrap) 1 x Per Week/30 Days Discharge Instructions: Apply three layer compression as directed. Com pression Wrap: Stockinette 1 x Per Week/30 Days WOUND #4: - Foot Wound Laterality: Dorsal, Left Cleanser: Soap and Water 1 x Per Week/30 Days Discharge Instructions: May shower and wash wound with dial antibacterial soap and water prior to dressing change. Cleanser: Wound Cleanser 1 x Per Week/30 Days Discharge Instructions: Cleanse the wound with wound cleanser prior to applying a clean dressing using gauze sponges, not tissue or cotton balls. Topical: Triamcinolone 1 x Per Week/30 Days Discharge Instructions: Apply Triamcinolone as directed Prim Dressing: KerraCel Ag Gelling Fiber Dressing,  4x5 in (silver alginate) 1 x Per Week/30 Days ary Discharge Instructions: Apply silver alginate to wound bed as instructed Secondary Dressing: ABD Pad, 8x10 1 x Per Week/30 Days Discharge Instructions: Apply over primary dressing as directed. Secondary Dressing: Zetuvit Plus 4x8 in 1 x Per Week/30 Days Discharge Instructions: Apply over primary dressing as directed. Compression Wrap: ThreePress (3 layer compression wrap) 1 x Per Week/30 Days Discharge Instructions: Apply three layer compression as directed. Compression Wrap: Stockinette 1 x Per Week/30 Days 1. We have applied silver alginate, Zetuvit, ABDs and 3 layer compression bilaterally. I have asked him to come back on Monday or Tuesday to have the dressings changed him to see how we are doing. Emphasized elevation of his legs and if he is going to sleep in a chair at night he is going to need to keep his legs elevated. Without edema control we have no chance of making this better in terms of wound. 2 Liberal TCA to his skin under compression 3. We are going to have  to control his edema and have any chance of getting healing in this area. 4. I have asked him to call Dr. Deanne Coffer at [facility] medical for recommendations on the steroid. At this point I almost wonder about increasing it somewhat. #5 this patient is going to need mechanical debridement at some point of the adherent slough on the wide wound areas on the back of both calfs but I did not do this today there is simply too much swelling in too much pain. He also might require deep tissue cultures 6. 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 There was no evidence of other organ particularly kidney involvement. It was possible this was related to allopurinol although he has been on this for gout for a long period of time years. 5. I have asked him to call his primary doctor about pain management. He has been using [facility]) Signed: 06/04/2022 2:55:36 PM By: Duanne Guess MD FACS Signed: 06/05/2022 8:43:51 AM By: Baltazar Najjar MD Previous Signature: 06/01/2022 4:59:49 PM Version By: Baltazar Najjar MD Entered By: Duanne Guess on 06/04/2022 14:55:36 -------------------------------------------------------------------------------- Walter Details Patient Name: Date of Service: [name], Walter [name] [facility] T. 06/01/2022 8:00 A M Medical Record Number: [id number] Patient Account Number: [id number] Date of Birth/Sex: Treating RN: 1966-03-19 (56 y.o. Dianna Limbo Primary Care Provider: Lynnea Ferrier Other Clinician: Referring Provider: Treating Provider/Extender: Princella Pellegrini in Treatment: 0 Information Obtained From Patient Constitutional Symptoms (General Health) Complaints and Symptoms: Negative for: Fatigue; Fever; Chills; Marked Weight Change Eyes Complaints and Symptoms: Negative for: Dry Eyes; Vision Changes; Glasses / Contacts Ear/Nose/Mouth/Throat Complaints and Symptoms: Negative for: Chronic sinus  problems or rhinitis Respiratory Complaints and Symptoms: Negative for: Chronic or frequent coughs; Shortness of Breath Cardiovascular Complaints and Symptoms: Negative for: Chest pain Gastrointestinal Complaints and Symptoms: Negative for: Frequent diarrhea; Nausea; Vomiting Endocrine Complaints and Symptoms: Negative for: Heat/cold intolerance Medical History: Past Medical History Notes: Prediabetes Integumentary (Skin) Complaints and Symptoms: Positive for: Wounds - Vasculitis on Left Lower leg Musculoskeletal Complaints and Symptoms: Negative for: Muscle Pain; Muscle Weakness Medical History: Positive for: Gout Neurologic Complaints and Symptoms: Negative for: Numbness/parasthesias Psychiatric Complaints and Symptoms: Negative for: Claustrophobia; Suicidal Hematologic/Lymphatic Genitourinary Medical History: Past Medical History Notes: NW:GNFAOZ infections Immunological Oncologic Immunizations Pneumococcal Vaccine: Received Pneumococcal Vaccination: Yes Received Pneumococcal Vaccination On or After 60th Birthday: No Implantable Devices None Family and Social History Unknown History: Yes; Never smoker;  Marital Status - Married; Alcohol Use: Never; Drug Use: No History; Caffeine Use: Daily; Financial Concerns: No; Food, Clothing or Shelter Needs: No; Support System Lacking: No; Transportation Concerns: No Electronic Signature(s) Signed: 06/01/2022 4:59:49 PM By: Baltazar Najjar MD Signed: 06/01/2022 6:44:27 PM By: Karie Schwalbe RN Entered By: Karie Schwalbe on 06/01/2022 08:21:46 -------------------------------------------------------------------------------- SuperBill Details Patient Name: Date of Service: Walter [name], Walter [name] [facility] T. 06/01/2022 Medical Record Number: [id number] Patient Account Number: [id number] Date of Birth/Sex: Treating RN: February 18, 1966 (56 y.o. Dianna Limbo Primary Care Provider: Lynnea Ferrier Other Clinician: Referring  Provider: Treating Provider/Extender: Princella Pellegrini in Treatment: 0 Diagnosis Coding ICD-10 Codes Code Description [phone] Non-pressure chronic ulcer of other part of left lower leg with other specified severity L97.818 Non-pressure chronic ulcer of other part of right lower leg with other specified severity L95.8 Other vasculitis limited to the skin L97.528 Non-pressure chronic ulcer of other part of left foot with other specified severity L97.518 Non-pressure chronic ulcer of other part of right foot with other specified severity Facility Procedures CPT4: Code [id number] Description: 13 - WOUND CARE VISIT-LEV 3 EST PT Modifier: Quantity: 1 CPT4: [id number] Description: 81 BILATERAL: Application of multi-layer venous compression system; leg (below knee), including ankle and t. Modifier: Quantity: 1 Physician Procedures : CPT4 Code Description Modifier [id number] - WC PHYS LEVEL 4 - NEW PT ICD-10 Diagnosis Description L97.828 Non-pressure chronic ulcer of other part of left lower leg with other specified severity L97.818 Non-pressure chronic ulcer of other part of  right lower leg with other specified severity L95.8 Other vasculitis limited to the skin Quantity: 1 Electronic Signature(s) Signed: 06/01/2022 6:44:27 PM By: Karie Schwalbe RN Signed: 06/05/2022 8:43:51 AM By: Baltazar Najjar MD Previous Signature: 06/01/2022 4:59:49 PM Version By: Baltazar Najjar MD Entered By: Karie Schwalbe on 06/01/2022 18:38:56

## 2022-06-07 ENCOUNTER — Encounter (HOSPITAL_BASED_OUTPATIENT_CLINIC_OR_DEPARTMENT_OTHER): Payer: BC Managed Care – PPO | Admitting: General Surgery

## 2022-06-07 DIAGNOSIS — L97828 Non-pressure chronic ulcer of other part of left lower leg with other specified severity: Secondary | ICD-10-CM | POA: Diagnosis not present

## 2022-06-07 NOTE — Progress Notes (Addendum)
Walter [name] ([id number]) , . Visit Report for 06/07/2022 Chief Complaint Document Details Patient Name: Date of Service: HAYLEN, SHELNUTT [facility] T. 06/07/2022 2:00 PM Medical Record Number: [id number] Patient Account Number: [id number] Date of Birth/Sex: Treating RN: 1966/10/01 (56 y.o. M) Primary Care Provider: Lynnea Ferrier Other Clinician: Referring Provider: Treating Provider/Extender: Suzan Garibaldi in Treatment: 0 Information Obtained from: Patient Chief Complaint 06/01/2022; patient is here for review of predominantly wounds on his bilateral lower legs and also bilateral dorsal feet Electronic Signature(s) Signed: 06/07/2022 3:25:48 PM By: Duanne Guess MD FACS Entered By: Duanne Guess on 06/07/2022 15:25:47 -------------------------------------------------------------------------------- HPI Details Patient Name: Date of Service: Walter [name] LPH T. 06/07/2022 2:00 PM Medical Record Number: [id number] Patient Account Number: [id number] Date of Birth/Sex: Treating RN: Jun 21, 1966 (56 y.o. M) Primary Care Provider: Lynnea Ferrier Other Clinician: Referring Provider: Treating Provider/Extender: Suzan Garibaldi in Treatment: 0 History of Present Illness HPI Description: ADMISSION 06/01/2022 This is a 56 year old man who was problem began towards the end of March. He developed acute onset of of worsening painful rash involving his bilateral legs but also both arms and his back. At that time he was originally seen in urgent care this have been going on for a week. He also had marked bilateral lower extremity edema. 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 He was started on prednisone 60 mg. Had extensive blood work and revealing a CRP of 90 but ANCA negative. Hepatitis screen negative. He saw a  rheumatology Dr. Deanne Coffer at [facility]. He has him on Lasix 40 and prednisone 40 although the patient tells me that this is coming to an end. 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 He describes pain at 10 out of 10 made worse by putting any pressure on the back of his legs. He has been sleeping in a chair with his legs down Historically he may have had background lymphedema in his legs although I am really not certain about this. Past medical history includes a history of COPD/hypoxia. He is not a diabetic.He is a non-smoker 06/07/2022: This is my first encounter with the patient. He is visibly uncomfortable and complains of 10 out of 10 pain. He has been taking narcotics as prescribed by his primary care provider. He declines any physical examination beyond visual inspection. He has superficial wounds with slough accumulation on all the surfaces. 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 The circumference of his calves has gone down by 10 cm since being in compressive wraps. Electronic Signature(s) Signed: 06/07/2022 3:27:20 PM By: Duanne Guess MD FACS Entered By: Duanne Guess on 06/07/2022 15:27:19 -------------------------------------------------------------------------------- Physical Exam Details Patient Name: Date of Service: Walter [name], Walter [name] [facility] T. 06/07/2022 2:00 PM Medical Record Number: [id number] Patient Account Number: [id number] Date of Birth/Sex: Treating RN: [birth year] (56 y.o. M) Primary Care Provider: Lynnea Ferrier Other Clinician: Referring Provider: Treating Provider/Extender: Suzan Garibaldi in Treatment: 0 Constitutional . . . . Visibly uncomfortable but not in any acute distress. Respiratory Normal work of breathing on room air.. Notes 06/07/2022: He has superficial wounds with slough accumulation on all the surfaces. He has skin changes consistent with his diagnosis of  vasculitis. The circumference of his calves has gone down by 10 cm since being in compressive wraps. Electronic Signature(s) Signed: 06/07/2022 3:28:15 PM By: Lady Gary,  Victorino Dike MD FACS Entered By: Duanne Guess on 06/07/2022 15:28:15 -------------------------------------------------------------------------------- Physician Orders Details Patient Name: Date of Service: Walter [name], Walter [name] [facility] T. 06/07/2022 2:00 PM Medical Record Number: [id number] Patient Account Number: [id number] Date of Birth/Sex: Treating RN: [birth year] (56 y.o. M) Primary Care Provider: Lynnea Ferrier Other Clinician: Referring Provider: Treating Provider/Extender: Suzan Garibaldi in Treatment: 0 Verbal / Phone Orders: No Diagnosis Coding ICD-10 Coding Code Description [phone] Non-pressure chronic ulcer of other part of left lower leg with other specified severity L97.818 Non-pressure chronic ulcer of other part of right lower leg with other specified severity L95.8 Other vasculitis limited to the skin L97.528 Non-pressure chronic ulcer of other part of left foot with other specified severity L97.518 Non-pressure chronic ulcer of other part of right foot with other specified severity Follow-up Appointments ppointment in 1 week. - Dr. Lady Gary Room 3/4 Return A Bathing/ Shower/ Hygiene Other Bathing/Shower/Hygiene Orders/Instructions: - Please keep leg wraps dry-sponge bathe for now Edema Control - Lymphedema / SCD / Other Bilateral Lower Extremities Elevate legs to the level of the heart or above for 30 minutes daily and/or when sitting, a frequency of: - As much as possible-Elevate throughout the day. void standing for long periods of time. - Frequently throughout the day A Wound Treatment Wound #1 - Lower Leg Wound Laterality: Left, Posterior Cleanser: Soap and Water 1 x Per Week/30 Days Discharge Instructions: May shower and wash wound with dial antibacterial soap and water prior to dressing  change. Cleanser: Wound Cleanser 1 x Per Week/30 Days Discharge Instructions: Cleanse the wound with wound cleanser prior to applying a clean dressing using gauze sponges, not tissue or cotton balls. Topical: Triamcinolone 1 x Per Week/30 Days Discharge Instructions: Apply Triamcinolone as directed Prim Dressing: KerraCel Ag Gelling Fiber Dressing, 4x5 in (silver alginate) 1 x Per Week/30 Days ary Discharge Instructions: Apply silver alginate to wound bed as instructed Prim Dressing: Santyl Ointment 1 x Per Week/30 Days ary Discharge Instructions: Apply nickel thick amount to wound bed as instructed Secondary Dressing: ABD Pad, 8x10 1 x Per Week/30 Days Discharge Instructions: Apply over primary dressing as directed. Secondary Dressing: Zetuvit Plus 4x8 in 1 x Per Week/30 Days Discharge Instructions: Apply over primary dressing as directed. Compression Wrap: ThreePress (3 layer compression wrap) 1 x Per Week/30 Days Discharge Instructions: Apply three layer compression as directed. Compression Wrap: Stockinette 1 x Per Week/30 Days Wound #2 - Lower Leg Wound Laterality: Right, Posterior Cleanser: Soap and Water 1 x Per Week/30 Days Discharge Instructions: May shower and wash wound with dial antibacterial soap and water prior to dressing change. Cleanser: Wound Cleanser 1 x Per Week/30 Days Discharge Instructions: Cleanse the wound with wound cleanser prior to applying a clean dressing using gauze sponges, not tissue or cotton balls. Topical: Triamcinolone 1 x Per Week/30 Days Discharge Instructions: Apply Triamcinolone as directed Prim Dressing: KerraCel Ag Gelling Fiber Dressing, 4x5 in (silver alginate) 1 x Per Week/30 Days ary Discharge Instructions: Apply silver alginate to wound bed as instructed Prim Dressing: Santyl Ointment 1 x Per Week/30 Days ary Discharge Instructions: Apply nickel thick amount to wound bed as instructed Secondary Dressing: ABD Pad, 8x10 1 x Per Week/30  Days Discharge Instructions: Apply over primary dressing as directed. Secondary Dressing: Zetuvit Plus 4x8 in 1 x Per Week/30 Days Discharge Instructions: Apply over primary dressing as directed. Compression Wrap: ThreePress (3 layer compression wrap) 1 x Per Week/30 Days Discharge Instructions: Apply three layer compression as directed. Compression Wrap: Stockinette 1  x Per Week/30 Days Wound #5 - Lower Leg Wound Laterality: Right, Anterior Cleanser: Soap and Water 1 x Per Week/30 Days Discharge Instructions: May shower and wash wound with dial antibacterial soap and water prior to dressing change. Cleanser: Wound Cleanser 1 x Per Week/30 Days Discharge Instructions: Cleanse the wound with wound cleanser prior to applying a clean dressing using gauze sponges, not tissue or cotton balls. Topical: Triamcinolone 1 x Per Week/30 Days Discharge Instructions: Apply Triamcinolone as directed Prim Dressing: KerraCel Ag Gelling Fiber Dressing, 4x5 in (silver alginate) 1 x Per Week/30 Days ary Discharge Instructions: Apply silver alginate to wound bed as instructed Prim Dressing: Santyl Ointment 1 x Per Week/30 Days ary Discharge Instructions: Apply nickel thick amount to wound bed as instructed Secondary Dressing: ABD Pad, 8x10 1 x Per Week/30 Days Discharge Instructions: Apply over primary dressing as directed. Secondary Dressing: Zetuvit Plus 4x8 in 1 x Per Week/30 Days Discharge Instructions: Apply over primary dressing as directed. Compression Wrap: ThreePress (3 layer compression wrap) 1 x Per Week/30 Days Discharge Instructions: Apply three layer compression as directed. Compression Wrap: Stockinette 1 x Per Week/30 Days Wound #6 - Lower Leg Wound Laterality: Left, Anterior Cleanser: Soap and Water 1 x Per Week/30 Days Discharge Instructions: May shower and wash wound with dial antibacterial soap and water prior to dressing change. Cleanser: Wound Cleanser 1 x Per Week/30 Days Discharge  Instructions: Cleanse the wound with wound cleanser prior to applying a clean dressing using gauze sponges, not tissue or cotton balls. Topical: Triamcinolone 1 x Per Week/30 Days Discharge Instructions: Apply Triamcinolone as directed Prim Dressing: KerraCel Ag Gelling Fiber Dressing, 4x5 in (silver alginate) 1 x Per Week/30 Days ary Discharge Instructions: Apply silver alginate to wound bed as instructed Prim Dressing: Santyl Ointment 1 x Per Week/30 Days ary Discharge Instructions: Apply nickel thick amount to wound bed as instructed Secondary Dressing: ABD Pad, 8x10 1 x Per Week/30 Days Discharge Instructions: Apply over primary dressing as directed. Secondary Dressing: Zetuvit Plus 4x8 in 1 x Per Week/30 Days Discharge Instructions: Apply over primary dressing as directed. Compression Wrap: ThreePress (3 layer compression wrap) 1 x Per Week/30 Days Discharge Instructions: Apply three layer compression as directed. Compression Wrap: Stockinette 1 x Per Week/30 Days Electronic Signature(s) Signed: 06/07/2022 3:28:59 PM By: Duanne Guess MD FACS Entered By: Duanne Guess on 06/07/2022 15:28:59 -------------------------------------------------------------------------------- Problem List Details Patient Name: Date of Service: Walter [name], Walter [name] [facility] T. 06/07/2022 2:00 PM Medical Record Number: [id number] Patient Account Number: [id number] Date of Birth/Sex: Treating RN: May 19, 1966 (56 y.o. M) Primary Care Provider: Lynnea Ferrier Other Clinician: Referring Provider: Treating Provider/Extender: Suzan Garibaldi in Treatment: 0 Active Problems ICD-10 Encounter Code Description Active Date MDM Diagnosis L97.828 Non-pressure chronic ulcer of other part of left lower leg with other specified 06/01/2022 No Yes severity L97.818 Non-pressure chronic ulcer of other part of right lower leg with other specified 06/01/2022 No Yes severity L95.8 Other vasculitis limited  to the skin 06/01/2022 No Yes L97.528 Non-pressure chronic ulcer of other part of left foot with other specified 06/01/2022 No Yes severity L97.518 Non-pressure chronic ulcer of other part of right foot with other specified 06/01/2022 No Yes severity Inactive Problems Resolved Problems Electronic Signature(s) Signed: 06/07/2022 3:20:19 PM By: Duanne Guess MD FACS Entered By: Duanne Guess on 06/07/2022 15:20:18 -------------------------------------------------------------------------------- Progress Note Details Patient Name: Date of Service: Walter [name] LPH T. 06/07/2022 2:00 PM Medical Record Number: [id number] Patient Account Number: [id number] Date of Birth/Sex: Treating RN: 14-Apr-1966 (  56 y.o. M) Primary Care Provider: Lynnea Ferrier Other Clinician: Referring Provider: Treating Provider/Extender: Suzan Garibaldi in Treatment: 0 Subjective Chief Complaint Information obtained from Patient 06/01/2022; patient is here for review of predominantly wounds on his bilateral lower legs and also bilateral dorsal feet History of Present Illness (HPI) ADMISSION 06/01/2022 This is a 56 year old man who was problem began towards the end of March. He developed acute onset of of worsening painful rash involving his bilateral legs but also both arms and his back. At that time he was originally seen in urgent care this have been going on for a week. He also had marked bilateral lower extremity edema. 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 He was started on prednisone 60 mg. Had extensive blood work and revealing a CRP of 90 but ANCA negative. Hepatitis screen negative. He saw a rheumatology Dr. Deanne Coffer at [facility]. He has him on Lasix 40 and prednisone 40 although the patient tells me that this is coming to an end.  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 He describes pain at 10 out of 10 made worse by putting any pressure on the back of his legs. He has been sleeping in a chair with his legs down Historically he may have had background lymphedema in his legs although I am really not certain about this. Past medical history includes a history of COPD/hypoxia. He is not a diabetic.He is a non-smoker 06/07/2022: This is my first encounter with the patient. He is visibly uncomfortable and complains of 10 out of 10 pain. He has been taking narcotics as prescribed by his primary care provider. He declines any physical examination beyond visual inspection. He has superficial wounds with slough accumulation on all the surfaces. 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 The circumference of his calves has gone down by 10 cm since being in compressive wraps. Patient History Information obtained from Patient. Family History Unknown History. Social History Never smoker, Marital Status - Married, Alcohol Use - Never, Drug Use - No History, Caffeine Use - Daily. Medical History Musculoskeletal Patient has history of Gout Medical A Surgical History Notes nd Endocrine Prediabetes Genitourinary XL:KGMWNU infections Objective Constitutional Visibly uncomfortable but not in any acute distress. Vitals Time Taken: 2:30 PM, Height: 75 in, Weight: 325 lbs, BMI: 40.6, Temperature: 98.0 F, Pulse: 80 bpm, Respiratory Rate: 20 breaths/min, Blood Pressure: 134/79 mmHg. Respiratory Normal work of breathing on room air.. General Notes: 06/07/2022: He has superficial wounds with slough accumulation on all the surfaces. 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 The circumference of his calves has gone down by 10 cm since being in compressive wraps. Integumentary (Hair, Skin) Wound #1 status is Open. Original cause of wound was Gradually Appeared. The date  acquired was: 02/10/2022. The wound is located on the Left,Posterior Lower Leg. The wound measures 21cm length x 12cm width x 0.1cm depth; 197.92cm^2 area and 19.792cm^3 volume. There is Fat Layer (Subcutaneous Tissue) exposed. There is no tunneling or undermining noted. There is a large amount of serous drainage noted. There is small (1-33%) pink granulation within the wound bed. There is a large (67-100%) amount of necrotic tissue within the wound bed. Wound #2 status is Open. Original cause of wound was Gradually Appeared. The date acquired was: 02/10/2022. The wound is located  on the Right,Posterior Lower Leg. The wound measures 26cm length x 11cm width x 0.1cm depth; 224.624cm^2 area and 22.462cm^3 volume. There is Fat Layer (Subcutaneous Tissue) exposed. There is no tunneling or undermining noted. There is a large amount of serous drainage noted. There is small (1-33%) granulation within the wound bed. There is a large (67-100%) amount of necrotic tissue within the wound bed including Adherent Slough. Wound #3 status is Healed - Epithelialized. Original cause of wound was Gradually Appeared. The date acquired was: 02/10/2022. The wound is located on the Right,Dorsal Foot. The wound measures 0cm length x 0cm width x 0cm depth; 0cm^2 area and 0cm^3 volume. There is no tunneling or undermining noted. There is a none present amount of drainage noted. There is no granulation within the wound bed. There is no necrotic tissue within the wound bed. Wound #4 status is Healed - Epithelialized. Original cause of wound was Gradually Appeared. The date acquired was: 02/10/2022. The wound is located on the Left,Dorsal Foot. The wound measures 0cm length x 0cm width x 0cm depth; 0cm^2 area and 0cm^3 volume. There is no tunneling or undermining noted. There is a none present amount of drainage noted. There is no granulation within the wound bed. There is no necrotic tissue within the wound bed. Wound #5 status is  Open. Original cause of wound was Gradually Appeared. The date acquired was: 06/07/2022. The wound is located on the Right,Anterior Lower Leg. The wound measures 9cm length x 3cm width x 0.1cm depth; 21.206cm^2 area and 2.121cm^3 volume. There is Fat Layer (Subcutaneous Tissue) exposed. There is no tunneling or undermining noted. There is a large amount of serosanguineous drainage noted. The wound margin is distinct with the outline attached to the wound base. There is no granulation within the wound bed. There is a large (67-100%) amount of necrotic tissue within the wound bed including Adherent Slough. Wound #6 status is Open. Original cause of wound was Gradually Appeared. The date acquired was: 06/07/2022. The wound is located on the Left,Anterior Lower Leg. The wound measures 5cm length x 5cm width x 0.1cm depth; 19.635cm^2 area and 1.963cm^3 volume. There is Fat Layer (Subcutaneous Tissue) exposed. There is no tunneling or undermining noted. There is a large amount of serosanguineous drainage noted. The wound margin is distinct with the outline attached to the wound base. There is medium (34-66%) red granulation within the wound bed. There is a medium (34-66%) amount of necrotic tissue within the wound bed including Adherent Slough. 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 There was a Three Layer Compression Therapy Procedure Post procedure Diagnosis Wound #1: Same as Pre-Procedure Wound #2 Pre-procedure  diagnosis of Wound #2 is a Vasculitis located on the Right,Posterior Lower Leg . 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 There was a Three Layer Compression Therapy Procedure Post procedure Diagnosis Wound #5: Same as Pre-Procedure Wound #6 Pre-procedure diagnosis of  Wound #6 is a Venous Leg Ulcer located on the Left,Anterior Lower Leg . There was a Three Layer Compression Therapy Procedure Post procedure Diagnosis Wound #6: Same as Pre-Procedure Plan Follow-up Appointments: Return Appointment in 1 week. - Dr. Lady Gary Room 3/4 Bathing/ Shower/ Hygiene: Other Bathing/Shower/Hygiene Orders/Instructions: - Please keep leg wraps dry-sponge bathe for now Edema Control - Lymphedema / SCD / Other: Elevate legs to the level of the heart or above for 30 minutes daily and/or when sitting, a frequency of: - As much as possible-Elevate throughout the day. Avoid standing for long periods of time. - Frequently throughout the day WOUND #1: - Lower Leg Wound Laterality: Left, Posterior Cleanser: Soap and Water 1 x Per Week/30 Days Discharge Instructions: May shower and wash wound with dial antibacterial soap and water prior to dressing change. Cleanser: Wound Cleanser 1 x Per Week/30 Days Discharge Instructions: Cleanse the wound with wound cleanser prior to applying a clean dressing using gauze sponges, not tissue or cotton balls. Topical: Triamcinolone 1 x Per Week/30 Days Discharge Instructions: Apply Triamcinolone as directed Prim Dressing: KerraCel Ag Gelling Fiber Dressing, 4x5 in (silver alginate) 1 x Per Week/30 Days ary Discharge Instructions: Apply silver alginate to wound bed as instructed Prim Dressing: Santyl Ointment 1 x Per Week/30 Days ary Discharge Instructions: Apply nickel thick amount to wound bed as  instructed Secondary Dressing: ABD Pad, 8x10 1 x Per Week/30 Days Discharge Instructions: Apply over primary dressing as directed. Secondary Dressing: Zetuvit Plus 4x8 in 1 x Per Week/30 Days Discharge Instructions: Apply over primary dressing as directed. Com pression Wrap: ThreePress (3 layer compression wrap) 1 x Per Week/30 Days Discharge Instructions: Apply three layer compression as directed. Com pression Wrap: Stockinette 1 x Per Week/30 Days WOUND #2: - Lower Leg Wound Laterality: Right, Posterior Cleanser: Soap and Water 1 x Per Week/30 Days Discharge Instructions: May shower and wash wound with dial antibacterial soap and water prior to dressing change. Cleanser: Wound Cleanser 1 x Per Week/30 Days Discharge Instructions: Cleanse the wound with wound cleanser prior to applying a clean dressing using gauze sponges, not tissue or cotton balls. Topical: Triamcinolone 1 x Per Week/30 Days Discharge Instructions: Apply Triamcinolone as directed Prim Dressing: KerraCel Ag Gelling Fiber Dressing, 4x5 in (silver alginate) 1 x Per Week/30 Days ary Discharge Instructions: Apply silver alginate to wound bed as instructed Prim Dressing: Santyl Ointment 1 x Per Week/30 Days ary Discharge Instructions: Apply nickel thick amount to wound bed as instructed Secondary Dressing: ABD Pad, 8x10 1 x Per Week/30 Days Discharge Instructions: Apply over primary dressing as directed. Secondary Dressing: Zetuvit Plus 4x8 in 1 x Per Week/30 Days Discharge Instructions: Apply over primary dressing as directed. Com pression Wrap: ThreePress (3 layer compression wrap) 1 x Per Week/30 Days Discharge Instructions: Apply three layer compression as directed. Com pression Wrap: Stockinette 1 x Per Week/30 Days WOUND #5: - Lower Leg Wound Laterality: Right, Anterior Cleanser: Soap and Water 1 x Per Week/30 Days Discharge Instructions: May shower and wash wound with dial antibacterial soap and water prior to  dressing change. Cleanser: Wound Cleanser 1 x Per Week/30 Days Discharge Instructions: Cleanse the wound with wound cleanser prior to applying a clean dressing using gauze sponges, not tissue or cotton balls. Topical: Triamcinolone 1 x Per Week/30 Days Discharge Instructions: Apply Triamcinolone as directed Prim Dressing: KerraCel Ag Gelling Fiber Dressing, 4x5 in (silver alginate) 1 x Per Week/30 Days ary Discharge Instructions: Apply silver alginate to wound bed as instructed Prim  Dressing: Santyl Ointment 1 x Per Week/30 Days ary Discharge Instructions: Apply nickel thick amount to wound bed as instructed Secondary Dressing: ABD Pad, 8x10 1 x Per Week/30 Days Discharge Instructions: Apply over primary dressing as directed. Secondary Dressing: Zetuvit Plus 4x8 in 1 x Per Week/30 Days Discharge Instructions: Apply over primary dressing as directed. Com pression Wrap: ThreePress (3 layer compression wrap) 1 x Per Week/30 Days Discharge Instructions: Apply three layer compression as directed. Com pression Wrap: Stockinette 1 x Per Week/30 Days WOUND #6: - Lower Leg Wound Laterality: Left, Anterior Cleanser: Soap and Water 1 x Per Week/30 Days Discharge Instructions: May shower and wash wound with dial antibacterial soap and water prior to dressing change. Cleanser: Wound Cleanser 1 x Per Week/30 Days Discharge Instructions: Cleanse the wound with wound cleanser prior to applying a clean dressing using gauze sponges, not tissue or cotton balls. Topical: Triamcinolone 1 x Per Week/30 Days Discharge Instructions: Apply Triamcinolone as directed Prim Dressing: KerraCel Ag Gelling Fiber Dressing, 4x5 in (silver alginate) 1 x Per Week/30 Days ary Discharge Instructions: Apply silver alginate to wound bed as instructed Prim Dressing: Santyl Ointment 1 x Per Week/30 Days ary Discharge Instructions: Apply nickel thick amount to wound bed as instructed Secondary Dressing: ABD Pad, 8x10 1 x Per  Week/30 Days Discharge Instructions: Apply over primary dressing as directed. Secondary Dressing: Zetuvit Plus 4x8 in 1 x Per Week/30 Days Discharge Instructions: Apply over primary dressing as directed. Com pression Wrap: ThreePress (3 layer compression wrap) 1 x Per Week/30 Days Discharge Instructions: Apply three layer compression as directed. Compression Wrap: Stockinette 1 x Per Week/30 Days 06/07/2022: He has superficial wounds with slough accumulation on all the surfaces. 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 The circumference of his calves has gone down by 10 cm since being in compressive wraps. Although his wounds would definitely benefit from debridement of the accumulated slough, he refuses to permit any intervention. As a compromise, we will apply Santyl to all of the wound surfaces to try and enzymatically debride the buildup. We will continue with silver alginate and 3 layer compression. Follow-up in 1 week. Electronic Signature(s) Signed: 06/07/2022 3:29:43 PM By: Duanne Guess MD FACS Entered By: Duanne Guess on 06/07/2022 15:29:43 -------------------------------------------------------------------------------- HxROS Details Patient Name: Date of Service: Walter [name], Walter [name] LPH T. 06/07/2022 2:00 PM Medical Record Number: [id number] Patient Account Number: [id number] Date of Birth/Sex: Treating RN: 17-Jan-1966 (56 y.o. M) Primary Care Provider: Lynnea Ferrier Other Clinician: Referring Provider: Treating Provider/Extender: Suzan Garibaldi in Treatment: 0 Information Obtained From Patient Endocrine Medical History: Past Medical History Notes: Prediabetes Genitourinary Medical History: Past Medical History Notes: [id number] infections Musculoskeletal Medical History: Positive for: Gout Immunizations Pneumococcal Vaccine: Received Pneumococcal Vaccination: Yes Received Pneumococcal Vaccination On or After 60th  Birthday: No Implantable Devices None Family and Social History Unknown History: Yes; Never smoker; Marital Status - Married; Alcohol Use: Never; Drug Use: No History; Caffeine Use: Daily; Financial Concerns: No; Food, Clothing or Shelter Needs: No; Support System Lacking: No; Transportation Concerns: No Psychologist, prison and probation services) Signed: 06/07/2022 4:26:52 PM By: Duanne Guess MD FACS Entered By: Duanne Guess on 06/07/2022 15:27:24 -------------------------------------------------------------------------------- SuperBill Details Patient Name: Date of Service: Walter [name], Walter [name] [facility] T. 06/07/2022 Medical Record Number: [id number] Patient Account Number: [id number] Date of Birth/Sex: Treating RN: 07/22/1966 (56 y.o. 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 ICD-10 Diagnosis Description L97.828 Non-pressure chronic ulcer of other part of left lower leg with other specified severity  L97.818 Non-pressure chronic ulcer of other part of right lower leg with other specified severity L97.528 Non-pressure chronic ulcer of other part of left foot with other specified severity L97.518 Non-pressure chronic ulcer of other part of right foot  with other  specified severity Modifier: Quantity: 1 Physician Procedures : CPT4 Code Description Modifier [id number] - WC PHYS LEVEL 4 - EST PT ICD-10 Diagnosis Description L97.828 Non-pressure chronic ulcer of other part of left lower leg with other specified severity L97.818 Non-pressure chronic ulcer of other part of  right lower leg with other specified severity L97.528 Non-pressure chronic ulcer of other part of left foot with other specified severity L97.518 Non-pressure chronic ulcer of other part of right foot with other specified severity Quantity: 1 Electronic Signature(s) Signed: 06/07/2022 5:36:35 PM By: Redmond Pulling RN, BSN Signed: 06/08/2022 8:20:58 AM By: Duanne Guess MD FACS Previous Signature: 06/07/2022 3:29:58 PM Version By: Duanne Guess MD FACS Entered By: Redmond Pulling on 06/07/2022 16:30:39

## 2022-06-11 NOTE — Progress Notes (Signed)
Walter [name] ([id number]) , . Visit Report for 06/07/2022 Arrival Information Details Patient Name: Date of Service: Walter [name], Walter [name] [facility] T. 06/07/2022 2:00 PM Medical Record Number: [id number] Patient Account Number: [id number] Date of Birth/Sex: Treating RN: 1966-06-01 (56 y.o. M) Primary Care Jasai Sorg: Lynnea Ferrier Other Clinician: Referring Isabelle Matt: Treating Tenesha Garza/Extender: Suzan Garibaldi in Treatment: 0 Visit Information History Since Last Visit All ordered tests and consults were completed: Yes Patient Arrived: Ambulatory Added or deleted any medications: No Arrival Time: 14:27 Any new allergies or adverse reactions: No Accompanied By: spouse Had a fall or experienced change in No Transfer Assistance: None activities of daily living that may affect Patient Identification Verified: Yes risk of falls: Secondary Verification Process Completed: Yes Signs or symptoms of abuse/neglect since last visito No Patient Requires Transmission-Based Precautions: No Hospitalized since last visit: No Patient Has Alerts: No Implantable device outside of the clinic excluding No cellular tissue based products placed in the center since last visit: Pain Present Now: Yes Electronic Signature(s) Signed: 06/11/2022 5:10:34 PM By: Tommie Ard RN Entered By: Tommie Ard on 06/07/2022 14:29:11 -------------------------------------------------------------------------------- Compression Therapy Details Patient Name: Date of Service: Walter [name], Walter [name] [facility] T. 06/07/2022 2:00 PM Medical Record Number: [id number] Patient Account Number: [id number] Date of Birth/Sex: Treating RN: 1966-02-10 (56 y.o. M) Primary Care Jozey Janco: Lynnea Ferrier Other Clinician: Referring Geordan Xu: Treating Skya Mccullum/Extender: Suzan Garibaldi in Treatment: 0 Compression Therapy Performed for Wound Assessment: Wound #1 Left,Posterior Lower Leg Performed By: Clinician  , Compression Type: Three Layer Post Procedure Diagnosis Same as Pre-procedure Electronic Signature(s) Signed: 06/11/2022 5:10:34 PM By: Tommie Ard RN Entered By: Tommie Ard on 06/07/2022 15:07:53 -------------------------------------------------------------------------------- Compression Therapy Details Patient Name: Date of Service: Walter [name], Walter [name] [facility] T. 06/07/2022 2:00 PM Medical Record Number: [id number] Patient Account Number: [id number] Date of Birth/Sex: Treating RN: 03/17/1966 (56 y.o. M) Primary Care Makayli Bracken: Lynnea Ferrier Other Clinician: Referring Kairah Leoni: Treating Skylinn Vialpando/Extender: Suzan Garibaldi in Treatment: 0 Compression Therapy Performed for Wound Assessment: Wound #2 Right,Posterior Lower Leg Performed By: Clinician , Compression Type: Three Layer Post Procedure Diagnosis Same as Pre-procedure Electronic Signature(s) Signed: 06/11/2022 5:10:34 PM By: Tommie Ard RN Entered By: Tommie Ard on 06/07/2022 15:07:53 -------------------------------------------------------------------------------- Compression Therapy Details Patient Name: Date of Service: Walter [name], Walter [name] [facility] T. 06/07/2022 2:00 PM Medical Record Number: [id number] Patient Account Number: [id number] Date of Birth/Sex: Treating RN: [birth year] (56 y.o. M) Primary Care Cambell Rickenbach: Lynnea Ferrier Other Clinician: Referring Kaylamarie Swickard: Treating Shirl Weir/Extender: Suzan Garibaldi in Treatment: 0 Compression Therapy Performed for Wound Assessment: Wound #5 Right,Anterior Lower Leg Performed By: Clinician , Compression Type: Three Layer Post Procedure Diagnosis Same as Pre-procedure Electronic Signature(s) Signed: 06/11/2022 5:10:34 PM By: Tommie Ard RN Entered By: Tommie Ard on 06/07/2022 15:07:53 -------------------------------------------------------------------------------- Compression Therapy Details Patient Name: Date of Service: Walter [name], Walter [name] [facility] T. 06/07/2022 2:00 PM Medical Record Number: [id number] Patient Account Number: [id number] Date of Birth/Sex: Treating RN: 01/27/1966 (56 y.o. M) Primary Care Iban Utz: Lynnea Ferrier Other Clinician: Referring Marcelina Mclaurin: Treating Jaimie Pippins/Extender: Suzan Garibaldi in Treatment: 0 Compression Therapy Performed for Wound Assessment: Wound #6 Left,Anterior Lower Leg Performed By: Clinician , Compression Type: Three Layer Post Procedure Diagnosis Same as Pre-procedure Electronic Signature(s) Signed: 06/11/2022 5:10:34 PM By: Tommie Ard RN Signed: 06/11/2022 5:10:34 PM By: Tommie Ard RN Entered By: Tommie Ard on 06/07/2022 15:07:53 -------------------------------------------------------------------------------- Encounter Discharge Information Details Patient Name: Date of Service: Walter, [name] [facility] T. 06/07/2022 2:00 PM Medical Record Number: [id number] Patient Account Number:  [id number] Date of Birth/Sex: Treating RN: 03/18/1966 (56 y.o. Cline Cools Primary Care Anddy Wingert: Lynnea Ferrier Other Clinician: Referring Mariafernanda Hendricksen: Treating Roland Lipke/Extender: Suzan Garibaldi in Treatment: 0 Encounter Discharge Information Items Discharge Condition: Stable Ambulatory Status: Ambulatory Discharge Destination: Home Transportation: Private Auto Accompanied By: wife Schedule Follow-up Appointment: Yes Clinical Summary of Care: Patient Declined Electronic Signature(s) Signed: 06/07/2022 5:36:35 PM By: Redmond Pulling RN, BSN Entered By: Redmond Pulling on 06/07/2022 16:31:32 -------------------------------------------------------------------------------- Lower Extremity Assessment Details Patient Name: Date of Service: Walter [name], Walter [name] [facility] T. 06/07/2022 2:00 PM Medical Record Number: [id number] Patient Account Number: [id number] Date of Birth/Sex: Treating RN: 02-Oct-1966 (56 y.o. M) Primary Care Ajai Terhaar: Lynnea Ferrier Other  Clinician: Referring Jeramy Dimmick: Treating Priyal Musquiz/Extender: Suzan Garibaldi in Treatment: 0 Edema Assessment Assessed: [Left: No] [Right: No] E[Left: dema] [Right: :] Calf Left: Right: Point of Measurement: 33 cm From Medial Instep 46.4 cm 48.5 cm Ankle Left: Right: Point of Measurement: 11 cm From Medial Instep 31 cm 31.4 cm Vascular Assessment Pulses: Dorsalis Pedis Palpable: [Left:Yes] [Right:Yes] Electronic Signature(s) Signed: 06/11/2022 5:10:34 PM By: Tommie Ard RN Entered By: Tommie Ard on 06/07/2022 14:45:16 -------------------------------------------------------------------------------- Multi Wound Chart Details Patient Name: Date of Service: Walter [name], Walter [name] [facility] T. 06/07/2022 2:00 PM Medical Record Number: [id number] Patient Account Number: [id number] Date of Birth/Sex: Treating RN: Mar 20, 1966 (57 y.o. M) Primary Care Chayce Rullo: Lynnea Ferrier Other Clinician: Referring Traeger Sultana: Treating Hadlea Furuya/Extender: Suzan Garibaldi in Treatment: 0 Vital Signs Height(in): 75 Pulse(bpm): 80 Weight(lbs): 325 Blood Pressure(mmHg): 134/79 Body Mass Index(BMI): 40.6 Temperature(F): 98.0 Respiratory Rate(breaths/min): 20 Photos: Left, Posterior Lower Leg Right, Posterior Lower Leg Right, Dorsal Foot Wound Location: Gradually Appeared Gradually Appeared Gradually Appeared Wounding Event: Vasculitis Vasculitis Vasculitis Primary Etiology: Gout Gout Gout Comorbid History: 02/10/2022 02/10/2022 02/10/2022 Date Acquired: 0 0 0 Weeks of Treatment: Open Open Healed - Epithelialized Wound Status: No No No Wound Recurrence: Yes Yes Yes Clustered Wound: 21x12x0.1 26x11x0.1 0x0x0 Measurements L x W x D (cm) 197.92 224.624 0 A (cm) : rea 19.792 22.462 0 Volume (cm) : 71.20% 67.30% 100.00% % Reduction in A rea: 85.60% 83.70% 100.00% % Reduction in Volume: Full Thickness Without Exposed Full Thickness Without Exposed  Full Thickness Without Exposed Classification: Support Structures Support Structures Support Structures Large Large None Present Exudate Amount: Serous Serous N/A Exudate Type: Media planner N/A Exudate Color: N/A N/A N/A Wound Margin: Small (1-33%) Small (1-33%) None Present (0%) Granulation Amount: Pink N/A N/A Granulation Quality: Large (67-100%) Large (67-100%) None Present (0%) Necrotic Amount: Fat Layer (Subcutaneous Tissue): Yes Fat Layer (Subcutaneous Tissue): Yes Fascia: No Exposed Structures: Fascia: No Fascia: No Fat Layer (Subcutaneous Tissue): No Tendon: No Tendon: No Tendon: No Muscle: No Muscle: No Muscle: No Joint: No Joint: No Joint: No Bone: No Bone: No Bone: No None None Large (67-100%) Epithelialization: Compression Therapy Compression Therapy N/A Procedures Performed: Wound Number: [id number] 6  Photos: Left, Dorsal Foot Right, Anterior Lower Leg Left, Anterior Lower Leg Wound Location: Gradually Appeared Gradually Appeared Gradually Appeared Wounding Event: Vasculitis Venous Leg Ulcer Venous Leg Ulcer Primary Etiology: Gout Gout Gout Comorbid History: 02/10/2022 06/07/2022 06/07/2022 Date Acquired: 0 0 0 Weeks of Treatment: Healed - Epithelialized Open Open Wound Status: No No No Wound Recurrence: Yes No No Clustered Wound: 0x0x0 9x3x0.1 5x5x0.1 Measurements L x W x D (cm) 0 21.206 19.635 A (cm) : rea 0 2.121 1.963 Volume (cm) : 100.00% 0.00% 0.00% % Reduction in A rea: 100.00% 0.00% 0.00% % Reduction in Volume: Full  Thickness Without Exposed Full Thickness Without Exposed Full Thickness Without Exposed Classification: Support Structures Support Structures Support Structures None Present Large Large Exudate Amount: N/A Serosanguineous Serosanguineous Exudate Type: N/A red, brown red, brown Exudate Color: N/A Distinct, outline attached Distinct, outline attached Wound Margin: None Present (0%) None Present (0%) Medium  (34-66%) Granulation Amount: N/A N/A Red Granulation Quality: None Present (0%) Large (67-100%) Medium (34-66%) Necrotic Amount: Fascia: No Fat Layer (Subcutaneous Tissue): Yes Fat Layer (Subcutaneous Tissue): Yes Exposed Structures: Fat Layer (Subcutaneous Tissue): No Fascia: No Fascia: No Tendon: No Tendon: No Tendon: No Muscle: No Muscle: No Muscle: No Joint: No Joint: No Joint: No Bone: No Bone: No Bone: No Large (67-100%) None None Epithelialization: N/A Compression Therapy Compression Therapy Procedures Performed: Treatment Notes Electronic Signature(s) Signed: 06/07/2022 3:25:40 PM By: Duanne Guess MD FACS Entered By: Duanne Guess on 06/07/2022 15:25:40 -------------------------------------------------------------------------------- Multi-Disciplinary Care Plan Details Patient Name: Date of Service: Walter [name], Walter [name] [facility] T. 06/07/2022 2:00 PM Medical Record Number: [id number] Patient Account Number: [id number] Date of Birth/Sex: Treating RN: 1966/08/30 (56 y.o. M) Primary Care Curtiss Mahmood: Lynnea Ferrier Other Clinician: Referring Hanaan Gancarz: Treating Hassel Uphoff/Extender: Suzan Garibaldi in Treatment: 0 Active Inactive Pain, Acute or Chronic Nursing Diagnoses: Pain, acute or chronic: actual or potential Goals: Patient/caregiver will verbalize adequate pain control between visits Date Initiated: 06/01/2022 Target Resolution Date: 08/01/2022 Goal Status: Active Interventions: Provide education on pain management Notes: Electronic Signature(s) Signed: 06/11/2022 5:10:34 PM By: Tommie Ard RN Entered By: Tommie Ard on 06/07/2022 15:06:46 -------------------------------------------------------------------------------- Pain Assessment Details Patient Name: Date of Service: Walter [name], Walter [name] [facility] T. 06/07/2022 2:00 PM Medical Record Number: [id number] Patient Account Number: [id number] Date of Birth/Sex: Treating RN: 09-Dec-1965 (56 y.o.  M) Primary Care Tianne Plott: Lynnea Ferrier Other Clinician: Referring Carly Applegate: Treating Esaw Knippel/Extender: Suzan Garibaldi in Treatment: 0 Active Problems Location of Pain Severity and Description of Pain Patient Has Paino Yes Site Locations With Dressing Change: Yes Duration of the Pain. Constant / Intermittento Intermittent Rate the pain. Current Pain Level: 7 Worst Pain Level: 10 Character of Pain Describe the Pain: Aching Pain Management and Medication Current Pain Management: Medication: Yes Rest: Yes How does your wound impact your activities of daily livingo Sleep: Yes Work: No Psychologist, prison and probation services) Signed: 06/11/2022 5:10:34 PM By: Tommie Ard RN Entered By: Tommie Ard on 06/07/2022 14:32:26 -------------------------------------------------------------------------------- Patient/Caregiver Education Details Patient Name: Date of Service: Walter [name], Walter [name] [facility] T. 7/20/2023andnbsp2:00 PM Medical Record Number: [id number] Patient Account Number: [id number] Date of Birth/Gender: Treating RN: 1966-05-04 (56 y.o. M) Primary Care Physician: Lynnea Ferrier Other Clinician: Referring Physician: Treating Physician/Extender: Suzan Garibaldi in Treatment: 0 Education Assessment Education Provided To: Patient Education Topics Provided Venous: Methods: Explain/Verbal Responses: State content correctly Wound/Skin Impairment: Methods: Explain/Verbal Responses: State content correctly Electronic Signature(s) Signed: 06/11/2022 5:10:34 PM By: Tommie Ard RN Entered By: Tommie Ard on 06/07/2022 15:07:09 -------------------------------------------------------------------------------- Wound Assessment Details Patient Name: Date of Service: Walter [name], Walter [name] [facility] T. 06/07/2022 2:00 PM Medical Record Number: [id number] Patient Account Number: [id number] Date of Birth/Sex: Treating RN: [birth year] (56 y.o. M) Primary Care Adelyna Brockman:  Lynnea Ferrier Other Clinician: Referring Caylen Kuwahara: Treating Vinton Layson/Extender: Suzan Garibaldi in Treatment: 0 Wound Status Wound Number: 1 Primary Etiology: Vasculitis Wound Location: Left, Posterior Lower Leg Wound Status: Open Wounding Event: Gradually Appeared Comorbid History: Gout Date Acquired: 02/10/2022 Weeks Of Treatment: 0 Clustered Wound: Yes Photos Wound Measurements Length: (cm) 21 Width: (cm) 12 Depth: (cm) 0.1 Area: (cm) 197.92 Volume: (cm) 19.792 % Reduction  in Area: 71.2% % Reduction in Volume: 85.6% Epithelialization: None Tunneling: No Undermining: No Wound Description Classification: Full Thickness Without Exposed Support Structures Exudate Amount: Large Exudate Type: Serous Exudate Color: amber Foul Odor After Cleansing: No Slough/Fibrino Yes Wound Bed Granulation Amount: Small (1-33%) Exposed Structure Granulation Quality: Pink Fascia Exposed: No Necrotic Amount: Large (67-100%) Fat Layer (Subcutaneous Tissue) Exposed: Yes Tendon Exposed: No Muscle Exposed: No Joint Exposed: No Bone Exposed: No Treatment Notes Wound #1 (Lower Leg) Wound Laterality: Left, Posterior Cleanser Soap and Water Discharge Instruction: May shower and wash wound with dial antibacterial soap and water prior to dressing change. Wound Cleanser Discharge Instruction: Cleanse the wound with wound cleanser prior to applying a clean dressing using gauze sponges, not tissue or cotton balls. Peri-Wound Care Topical Triamcinolone Discharge Instruction: Apply Triamcinolone as directed Primary Dressing KerraCel Ag Gelling Fiber Dressing, 4x5 in (silver alginate) Discharge Instruction: Apply silver alginate to wound bed as instructed Santyl Ointment Discharge Instruction: Apply nickel thick amount to wound bed as instructed Secondary Dressing ABD Pad, 8x10 Discharge Instruction: Apply over primary dressing as directed. Zetuvit Plus 4x8  in Discharge Instruction: Apply over primary dressing as directed. Secured With Compression Wrap ThreePress (3 layer compression wrap) Discharge Instruction: Apply three layer compression as directed. Stockinette Compression Stockings Facilities manager) Signed: 06/11/2022 5:10:34 PM By: Tommie Ard RN Entered By: Tommie Ard on 06/07/2022 14:59:52 -------------------------------------------------------------------------------- Wound Assessment Details Patient Name: Date of Service: Walter [name], Walter [name] [facility] T. 06/07/2022 2:00 PM Medical Record Number: [id number] Patient Account Number: [id number] Date of Birth/Sex: Treating RN: Feb 03, 1966 (56 y.o. M) Primary Care Zeeshan Korte: Lynnea Ferrier Other Clinician: Referring Danica Camarena: Treating Adelina Collard/Extender: Suzan Garibaldi in Treatment: 0 Wound Status Wound Number: 2 Primary Etiology: Vasculitis Wound Location: Right, Posterior Lower Leg Wound Status: Open Wounding Event: Gradually Appeared Comorbid History: Gout Date Acquired: 02/10/2022 Weeks Of Treatment: 0 Clustered Wound: Yes Photos Wound Measurements Length: (cm) 26 Width: (cm) 11 Depth: (cm) 0.1 Area: (cm) 224.624 Volume: (cm) 22.462 % Reduction in Area: 67.3% % Reduction in Volume: 83.7% Epithelialization: None Tunneling: No Undermining: No Wound Description Classification: Full Thickness Without Exposed Support Structures Exudate Amount: Large Exudate Type: Serous Exudate Color: amber Foul Odor After Cleansing: No Slough/Fibrino Yes Wound Bed Granulation Amount: Small (1-33%) Exposed Structure Necrotic Amount: Large (67-100%) Fascia Exposed: No Necrotic Quality: Adherent Slough Fat Layer (Subcutaneous Tissue) Exposed: Yes Tendon Exposed: No Muscle Exposed: No Joint Exposed: No Bone Exposed: No Treatment Notes Wound #2 (Lower Leg) Wound Laterality: Right, Posterior Cleanser Soap and Water Discharge Instruction: May  shower and wash wound with dial antibacterial soap and water prior to dressing change. Wound Cleanser Discharge Instruction: Cleanse the wound with wound cleanser prior to applying a clean dressing using gauze sponges, not tissue or cotton balls. Peri-Wound Care Topical Triamcinolone Discharge Instruction: Apply Triamcinolone as directed Primary Dressing KerraCel Ag Gelling Fiber Dressing, 4x5 in (silver alginate) Discharge Instruction: Apply silver alginate to wound bed as instructed Santyl Ointment Discharge Instruction: Apply nickel thick amount to wound bed as instructed Secondary Dressing ABD Pad, 8x10 Discharge Instruction: Apply over primary dressing as directed. Zetuvit Plus 4x8 in Discharge Instruction: Apply over primary dressing as directed. Secured With Compression Wrap ThreePress (3 layer compression wrap) Discharge Instruction: Apply three layer compression as directed. Stockinette Compression Stockings Facilities manager) Signed: 06/11/2022 5:10:34 PM By: Tommie Ard RN Entered By: Tommie Ard on 06/07/2022 14:59:14 -------------------------------------------------------------------------------- Wound Assessment Details Patient Name: Date of Service: ITSUKI, RIPOLL [facility] T. 06/07/2022 2:00 PM Medical  Record Number: [id number] Patient Account Number: [id number] Date of Birth/Sex: Treating RN: 06-29-1966 (56 y.o. M) Primary Care Delene Morais: Lynnea Ferrier Other Clinician: Referring Abrie Egloff: Treating Kaniel Kiang/Extender: Suzan Garibaldi in Treatment: 0 Wound Status Wound Number: 3 Primary Etiology: Vasculitis Wound Location: Right, Dorsal Foot Wound Status: Healed - Epithelialized Wounding Event: Gradually Appeared Comorbid History: Gout Date Acquired: 02/10/2022 Weeks Of Treatment: 0 Clustered Wound: Yes Photos Wound Measurements Length: (cm) Width: (cm) Depth: (cm) Area: (cm) Volume: (cm) 0 % Reduction in Area:  100% 0 % Reduction in Volume: 100% 0 Epithelialization: Large (67-100%) 0 Tunneling: No 0 Undermining: No Wound Description Classification: Full Thickness Without Exposed Support Structures Exudate Amount: None Present Foul Odor After Cleansing: No Slough/Fibrino No Wound Bed Granulation Amount: None Present (0%) Exposed Structure Necrotic Amount: None Present (0%) Fascia Exposed: No Fat Layer (Subcutaneous Tissue) Exposed: No Tendon Exposed: No Muscle Exposed: No Joint Exposed: No Bone Exposed: No Electronic Signature(s) Signed: 06/11/2022 5:10:34 PM By: Tommie Ard RN Entered By: Tommie Ard on 06/07/2022 14:48:36 -------------------------------------------------------------------------------- Wound Assessment Details Patient Name: Date of Service: IVAN, LACHER [facility] T. 06/07/2022 2:00 PM Medical Record Number: [id number] Patient Account Number: [id number] Date of Birth/Sex: Treating RN: [birth year] (56 y.o. M) Primary Care Nyellie Yetter: Lynnea Ferrier Other Clinician: Referring Lorey Pallett: Treating Johnanthony Wilden/Extender: Suzan Garibaldi in Treatment: 0 Wound Status Wound Number: 4 Primary Etiology: Vasculitis Wound Location: Left, Dorsal Foot Wound Status: Healed - Epithelialized Wounding Event: Gradually Appeared Comorbid History: Gout Date Acquired: 02/10/2022 Weeks Of Treatment: 0 Clustered Wound: Yes Photos Wound Measurements Length: (cm) Width: (cm) Depth: (cm) Area: (cm) Volume: (cm) 0 % Reduction in Area: 100% 0 % Reduction in Volume: 100% 0 Epithelialization: Large (67-100%) 0 Tunneling: No 0 Undermining: No Wound Description Classification: Full Thickness Without Exposed Support Structures Exudate Amount: None Present Foul Odor After Cleansing: No Slough/Fibrino No Wound Bed Granulation Amount: None Present (0%) Exposed Structure Necrotic Amount: None Present (0%) Fascia Exposed: No Fat Layer (Subcutaneous Tissue) Exposed:  No Tendon Exposed: No Muscle Exposed: No Joint Exposed: No Bone Exposed: No Electronic Signature(s) Signed: 06/11/2022 5:10:34 PM By: Tommie Ard RN Entered By: Tommie Ard on 06/07/2022 14:49:21 -------------------------------------------------------------------------------- Wound Assessment Details Patient Name: Date of Service: SKANDA, WORLDS [facility] T. 06/07/2022 2:00 PM Medical Record Number: [id number] Patient Account Number: [id number] Date of Birth/Sex: Treating RN: 1966/10/17 (56 y.o. M) Primary Care Jaelyn Bourgoin: Lynnea Ferrier Other Clinician: Referring Satsuki Zillmer: Treating Mayvis Agudelo/Extender: Suzan Garibaldi in Treatment: 0 Wound Status Wound Number: 5 Primary Etiology: Venous Leg Ulcer Wound Location: Right, Anterior Lower Leg Wound Status: Open Wounding Event: Gradually Appeared Comorbid History: Gout Date Acquired: 06/07/2022 Weeks Of Treatment: 0 Clustered Wound: No Photos Wound Measurements Length: (cm) 9 Width: (cm) 3 Depth: (cm) 0.1 Area: (cm) 21.206 Volume: (cm) 2.121 % Reduction in Area: 0% % Reduction in Volume: 0% Epithelialization: None Tunneling: No Undermining: No Wound Description Classification: Full Thickness Without Exposed Support Structures Wound Margin: Distinct, outline attached Exudate Amount: Large Exudate Type: Serosanguineous Exudate Color: red, brown Foul Odor After Cleansing: No Slough/Fibrino Yes Wound Bed Granulation Amount: None Present (0%) Exposed Structure Necrotic Amount: Large (67-100%) Fascia Exposed: No Necrotic Quality: Adherent Slough Fat Layer (Subcutaneous Tissue) Exposed: Yes Tendon Exposed: No Muscle Exposed: No Joint Exposed: No Bone Exposed: No Treatment Notes Wound #5 (Lower Leg) Wound Laterality: Right, Anterior Cleanser Soap and Water Discharge Instruction: May shower and wash wound with dial antibacterial soap and water prior to dressing change. Wound Cleanser Discharge  Instruction:  Cleanse the wound with wound cleanser prior to applying a clean dressing using gauze sponges, not tissue or cotton balls. Peri-Wound Care Topical Triamcinolone Discharge Instruction: Apply Triamcinolone as directed Primary Dressing KerraCel Ag Gelling Fiber Dressing, 4x5 in (silver alginate) Discharge Instruction: Apply silver alginate to wound bed as instructed Santyl Ointment Discharge Instruction: Apply nickel thick amount to wound bed as instructed Secondary Dressing ABD Pad, 8x10 Discharge Instruction: Apply over primary dressing as directed. Zetuvit Plus 4x8 in Discharge Instruction: Apply over primary dressing as directed. Secured With Compression Wrap ThreePress (3 layer compression wrap) Discharge Instruction: Apply three layer compression as directed. Stockinette Compression Stockings Facilities manager) Signed: 06/11/2022 5:10:34 PM By: Tommie Ard RN Entered By: Tommie Ard on 06/07/2022 14:59:08 -------------------------------------------------------------------------------- Wound Assessment Details Patient Name: Date of Service: GIANFRANCO, ARAKI [facility] T. 06/07/2022 2:00 PM Medical Record Number: [id number] Patient Account Number: [id number] Date of Birth/Sex: Treating RN: 1966-06-02 (56 y.o. M) Primary Care Saniyah Mondesir: Lynnea Ferrier Other Clinician: Referring Caressa Scearce: Treating Ellajane Stong/Extender: Suzan Garibaldi in Treatment: 0 Wound Status Wound Number: 6 Primary Etiology: Venous Leg Ulcer Wound Location: Left, Anterior Lower Leg Wound Status: Open Wounding Event: Gradually Appeared Comorbid History: Gout Date Acquired: 06/07/2022 Weeks Of Treatment: 0 Clustered Wound: No Photos Wound Measurements Length: (cm) 5 Width: (cm) 5 Depth: (cm) 0.1 Area: (cm) 19.635 Volume: (cm) 1.963 % Reduction in Area: 0% % Reduction in Volume: 0% Epithelialization: None Tunneling: No Undermining: No Wound  Description Classification: Full Thickness Without Exposed Support Structures Wound Margin: Distinct, outline attached Exudate Amount: Large Exudate Type: Serosanguineous Exudate Color: red, brown Wound Bed Granulation Amount: Medium (34-66%) Granulation Quality: Red Necrotic Amount: Medium (34-66%) Necrotic Quality: Adherent Slough Foul Odor After Cleansing: No Slough/Fibrino Yes Exposed Structure Fascia Exposed: No Fat Layer (Subcutaneous Tissue) Exposed: Yes Tendon Exposed: No Muscle Exposed: No Joint Exposed: No Bone Exposed: No Treatment Notes Wound #6 (Lower Leg) Wound Laterality: Left, Anterior Cleanser Soap and Water Discharge Instruction: May shower and wash wound with dial antibacterial soap and water prior to dressing change. Wound Cleanser Discharge Instruction: Cleanse the wound with wound cleanser prior to applying a clean dressing using gauze sponges, not tissue or cotton balls. Peri-Wound Care Topical Triamcinolone Discharge Instruction: Apply Triamcinolone as directed Primary Dressing KerraCel Ag Gelling Fiber Dressing, 4x5 in (silver alginate) Discharge Instruction: Apply silver alginate to wound bed as instructed Santyl Ointment Discharge Instruction: Apply nickel thick amount to wound bed as instructed Secondary Dressing ABD Pad, 8x10 Discharge Instruction: Apply over primary dressing as directed. Zetuvit Plus 4x8 in Discharge Instruction: Apply over primary dressing as directed. Secured With Compression Wrap ThreePress (3 layer compression wrap) Discharge Instruction: Apply three layer compression as directed. Stockinette Compression Stockings Facilities manager) Signed: 06/11/2022 5:10:34 PM By: Tommie Ard RN Entered By: Tommie Ard on 06/07/2022 14:59:44 -------------------------------------------------------------------------------- Vitals Details Patient Name: Date of Service: OSMIN, WELZ [facility] T. 06/07/2022 2:00 PM Medical  Record Number: [id number] Patient Account Number: [id number] Date of Birth/Sex: Treating RN: [birth year] (56 y.o. M) Primary Care [place]: Lynnea Ferrier Other Clinician: Referring Tifani Dack: Treating Sahith Nurse/Extender: Suzan Garibaldi in Treatment: 0 Vital Signs Time Taken: 14:30 Temperature (F): 98.0 Height (in): 75 Pulse (bpm): 80 Weight (lbs): 325 Respiratory Rate (breaths/min): 20 Body Mass Index (BMI): 40.6 Blood Pressure (mmHg): 134/79 Reference Range: 80 - 120 mg / dl Electronic Signature(s) Signed: 06/11/2022 5:10:34 PM By: Tommie Ard RN Entered By: Tommie Ard on 06/07/2022 14:31:03

## 2022-06-13 ENCOUNTER — Encounter (HOSPITAL_BASED_OUTPATIENT_CLINIC_OR_DEPARTMENT_OTHER): Payer: BC Managed Care – PPO | Admitting: General Surgery

## 2022-06-13 DIAGNOSIS — L97828 Non-pressure chronic ulcer of other part of left lower leg with other specified severity: Secondary | ICD-10-CM | POA: Diagnosis not present

## 2022-06-13 NOTE — Progress Notes (Signed)
Walter Walter [name] Walter [name] ([id number]) , . Visit Report for 06/13/2022 Arrival Information Details Patient Name: Date of Service: Walter Walter [name], Walter [name] [facility] T. 06/13/2022 2:00 PM Medical Record Number: [id number] Patient Account Number: [id number] Date of Birth/Sex: Treating RN: 1965-12-26 (56 y.o. Walter Walter [name] Walter [name] Primary Care Walter Walter [name] Walter [name]: Walter Walter [name] Walter [name] Other Clinician: Referring Walter Walter [name] Walter [name]: Treating Walter Walter [name] Walter [name]/Extender: Walter Walter [name] Walter [name] in Treatment: 1 Visit Information History Since Last Visit Added or deleted any medications: No Patient Arrived: Ambulatory Any new allergies or adverse reactions: No Arrival Time: 14:18 Had a fall or experienced change in No Accompanied By: self activities of daily living that may affect Transfer Assistance: None risk of falls: Patient Identification Verified: Yes Signs or symptoms of abuse/neglect since last visito No Patient Requires Transmission-Based Precautions: No Hospitalized since last visit: No Patient Has Alerts: No Implantable device outside of the clinic excluding No cellular tissue based products placed in the center since last visit: Has Dressing in Place as Prescribed: Yes Has Compression in Place as Prescribed: Yes Pain Present Now: Yes Electronic Signature(s) Signed: 06/13/2022 6:08:30 PM By: Karie Schwalbe RN Entered By: Karie Schwalbe on 06/13/2022 14:19:01 -------------------------------------------------------------------------------- Compression Therapy Details Patient Name: Date of Service: Walter Walter [name], Walter [name] [facility] T. 06/13/2022 2:00 PM Medical Record Number: [id number] Patient Account Number: [id number] Date of Birth/Sex: Treating RN: June 03, 1966 (56 y.o. Walter Walter [name] Walter [name] Primary Care Pebbles Zeiders: Walter Walter [name] Walter [name] Other Clinician: Referring Walter Walter [name] Walter [name]: Treating Walter Walter [name] Walter [name]/Extender: Walter Walter [name] Walter [name] in Treatment: 1 Compression Therapy Performed for Wound Assessment: Wound #1 Left,Posterior Lower Leg Performed  By: Clinician Karie Schwalbe, RN Compression Type: Three Layer Electronic Signature(s) Signed: 06/13/2022 6:08:30 PM By: Karie Schwalbe RN Entered By: Karie Schwalbe on 06/13/2022 14:23:02 -------------------------------------------------------------------------------- Compression Therapy Details Patient Name: Date of Service: Walter Walter [name], Walter [name] [facility] T. 06/13/2022 2:00 PM Medical Record Number: [id number] Patient Account Number: [id number] Date of Birth/Sex: Treating RN: 25-Jun-1966 (56 y.o. Walter Walter [name] Walter [name] Primary Care Royetta Probus: Walter Walter [name] Walter [name] Other Clinician: Referring Walter Walter [name] Walter [name]: Treating Walter Walter [name] Walter [name]/Extender: Walter Walter [name] Walter [name] in Treatment: 1 Compression Therapy Performed for Wound Assessment: Wound #2 Right,Posterior Lower Leg Performed By: Clinician Karie Schwalbe, RN Compression Type: Three Layer Electronic Signature(s) Signed: 06/13/2022 6:08:30 PM By: Karie Schwalbe RN Entered By: Karie Schwalbe on 06/13/2022 14:23:02 -------------------------------------------------------------------------------- Compression Therapy Details Patient Name: Date of Service: Walter Walter [name], Walter [name] [name] T. 06/13/2022 2:00 PM Medical Record Number: [id number] Patient Account Number: [id number] Date of Birth/Sex: Treating RN: 08-02-1966 (55 y.o. Walter Walter [name] Walter [name] Primary Care Baljit Liebert: Walter Walter [name] Walter [name] Other Clinician: Referring Walter Walter [name] Walter [name]: Treating Walter Walter [name] Walter [name]/Extender: Walter Walter [name] Walter [name] in Treatment: 1 Compression Therapy Performed for Wound Assessment: Wound #5 Right,Anterior Lower Leg Performed By: Clinician Karie Schwalbe, RN Compression Type: Three Layer Electronic Signature(s) Signed: 06/13/2022 6:08:30 PM By: Karie Schwalbe RN Entered By: Karie Schwalbe on 06/13/2022 14:23:02 -------------------------------------------------------------------------------- Compression Therapy Details Patient Name: Date of Service: Walter Walter [name], Walter [name] Walter Walter [name] [facility] T. 06/13/2022 2:00 PM Medical  Record Number: [id number] Patient Account Number: [id number] Date of Birth/Sex: Treating RN: 11-15-1966 (56 y.o. Walter Walter [name] Walter [name] Primary Care Walter Walter [name] Walter [name]: Walter Walter [name] Walter [name] Other Clinician: Referring Emme Rosenau: Treating Walter Walter [name] Walter [name]/Extender: Walter Walter [name] Walter [name] in Treatment: 1 Compression Therapy Performed for Wound Assessment: Wound #6 Left,Anterior Lower Leg Performed By: Clinician Karie Schwalbe, RN Compression Type: Three Layer Electronic Signature(s) Signed: 06/13/2022 6:08:30 PM By: Karie Schwalbe RN Entered By: Karie Schwalbe on 06/13/2022 14:23:02 -------------------------------------------------------------------------------- Encounter Discharge Information Details Patient Name: Date of Service: Walter Walter [name] Walter [name] [facility] T. 06/13/2022 2:00 PM Medical Record Number: [id number] Patient Account Number: [id number] Date of Birth/Sex: Treating RN: 24-Jan-1966 (56 y.o. Walter Walter [name] Walter [name] Primary  Care Walter Walter [name] Walter [name]: Other Clinician: Lynnea Walter [name] Referring Maniah Nading: Treating Walter Walter [name] Walter [name]/Extender: Walter Walter [name] Walter [name] in Treatment: 1 Encounter Discharge Information Items Discharge Condition: Stable Ambulatory Status: Ambulatory Discharge Destination: Home Transportation: Private Auto Accompanied By: self Schedule Follow-up Appointment: Yes Clinical Summary of Care: Patient Declined Electronic Signature(s) Signed: 06/13/2022 6:08:30 PM By: Karie Schwalbe RN Entered By: Karie Schwalbe on 06/13/2022 18:06:52 -------------------------------------------------------------------------------- Patient/Caregiver Education Details Patient Name: Date of Service: Walter Walter [name] Walter [name] Walter Walter [name] [facility] T. 7/26/2023andnbsp2:00 PM Medical Record Number: [id number] Patient Account Number: [id number] Date of Birth/Gender: Treating RN: 10-09-1966 (56 y.o. Walter Walter [name] Walter [name] Primary Care Physician: Walter Walter [name] Walter [name] Other Clinician: Referring Physician: Treating Physician/Extender: Walter Walter [name] Walter [name] in Treatment: 1 Education Assessment Education Provided To: Patient Education Topics Provided Pain: Methods: Explain/Verbal Responses: Return demonstration correctly Electronic Signature(s) Signed: 06/13/2022 6:08:30 PM By: Karie Schwalbe RN Entered By: Karie Schwalbe on 06/13/2022 18:06:27 -------------------------------------------------------------------------------- Wound Assessment Details Patient Name: Date of Service: Walter Walter [name], Walter [name] [facility] T. 06/13/2022 2:00 PM Medical Record Number: [id number] Patient Account Number: [id number] Date of Birth/Sex: Treating RN: 1966/11/10 (56 y.o. Walter Walter [name] Walter [name] Primary Care Sonya Pucci: Walter Walter [name] Walter [name] Other Clinician: Referring Georgana Romain: Treating Jeanell Mangan/Extender: Walter Walter [name] Walter [name] in Treatment: 1 Wound Status Wound Number: 1 Primary Etiology: Vasculitis Wound Location: Left, Posterior Lower Leg Wound Status: Open Wounding Event: Gradually Appeared Comorbid History: Gout Date Acquired: 02/10/2022 Weeks Of Treatment: 1 Clustered Wound: Yes Wound Measurements Length: (cm) 21 Width: (cm) 12 Depth: (cm) 0.1 Area: (cm) 197.92 Volume: (cm) 19.792 % Reduction in Area: 71.2% % Reduction in Volume: 85.6% Epithelialization: None Wound Description Classification: Full Thickness Without Exposed Support Structures Exudate Amount: Large Exudate Type: Serous Exudate Color: amber Foul Odor After Cleansing: No Slough/Fibrino Yes Wound Bed Granulation Amount: Small (1-33%) Exposed Structure Granulation Quality: Pink Fascia Exposed: No Necrotic Amount: Large (67-100%) Fat Layer (Subcutaneous Tissue) Exposed: Yes Tendon Exposed: No Muscle Exposed: No Joint Exposed: No Bone Exposed: No Treatment Notes Wound #1 (Lower Leg) Wound Laterality: Left, Posterior Cleanser Soap and Water Discharge Instruction: May shower and wash wound with dial antibacterial soap and water prior to dressing  change. Wound Cleanser Discharge Instruction: Cleanse the wound with wound cleanser prior to applying a clean dressing using gauze sponges, not tissue or cotton balls. Peri-Wound Care Topical Triamcinolone Discharge Instruction: Apply Triamcinolone as directed Primary Dressing KerraCel Ag Gelling Fiber Dressing, 4x5 in (silver alginate) Discharge Instruction: Apply silver alginate to wound bed as instructed Santyl Ointment Discharge Instruction: Apply nickel thick amount to wound bed as instructed Secondary Dressing ABD Pad, 8x10 Discharge Instruction: Apply over primary dressing as directed. Zetuvit Plus 4x8 in Discharge Instruction: Apply over primary dressing as directed. Secured With Compression Wrap ThreePress (3 layer compression wrap) Discharge Instruction: Apply three layer compression as directed. Stockinette Compression Stockings Facilities manager) Signed: 06/13/2022 6:08:30 PM By: Karie Schwalbe RN Entered By: Karie Schwalbe on 06/13/2022 14:20:35 -------------------------------------------------------------------------------- Wound Assessment Details Patient Name: Date of Service: Walter Walter [name], Walter [name] [facility] T. 06/13/2022 2:00 PM Medical Record Number: [id number] Patient Account Number: [id number] Date of Birth/Sex: Treating RN: Mar 08, 1966 (56 y.o. Walter Walter [name] Walter [name] Primary Care Levelle Edelen: Walter Walter [name] Walter [name] Other Clinician: Referring Adreonna Yontz: Treating Azaryah Oleksy/Extender: Walter Walter [name] Walter [name] in Treatment: 1 Wound Status Wound Number: 2 Primary Etiology: Vasculitis Wound Location: Right, Posterior Lower Leg Wound Status: Open Wounding Event: Gradually Appeared Comorbid History: Gout Date Acquired: 02/10/2022 Weeks Of Treatment: 1 Clustered Wound: Yes Wound Measurements Length: (cm) 26 Width: (cm) 11 Depth: (cm) 0.1 Area: (cm) 224.624 Volume: (cm) 22.462 % Reduction in  Area: 67.3% % Reduction in Volume: 83.7% Epithelialization:  None Wound Description Classification: Full Thickness Without Exposed Support Structures Exudate Amount: Large Exudate Type: Serous Exudate Color: amber Foul Odor After Cleansing: No Slough/Fibrino Yes Wound Bed Granulation Amount: Small (1-33%) Exposed Structure Necrotic Amount: Large (67-100%) Fascia Exposed: No Necrotic Quality: Adherent Slough Fat Layer (Subcutaneous Tissue) Exposed: Yes Tendon Exposed: No Muscle Exposed: No Joint Exposed: No Bone Exposed: No Treatment Notes Wound #2 (Lower Leg) Wound Laterality: Right, Posterior Cleanser Soap and Water Discharge Instruction: May shower and wash wound with dial antibacterial soap and water prior to dressing change. Wound Cleanser Discharge Instruction: Cleanse the wound with wound cleanser prior to applying a clean dressing using gauze sponges, not tissue or cotton balls. Peri-Wound Care Topical Triamcinolone Discharge Instruction: Apply Triamcinolone as directed Primary Dressing KerraCel Ag Gelling Fiber Dressing, 4x5 in (silver alginate) Discharge Instruction: Apply silver alginate to wound bed as instructed Santyl Ointment Discharge Instruction: Apply nickel thick amount to wound bed as instructed Secondary Dressing ABD Pad, 8x10 Discharge Instruction: Apply over primary dressing as directed. Zetuvit Plus 4x8 in Discharge Instruction: Apply over primary dressing as directed. Secured With Compression Wrap ThreePress (3 layer compression wrap) Discharge Instruction: Apply three layer compression as directed. Stockinette Compression Stockings Facilities manager) Signed: 06/13/2022 6:08:30 PM By: Karie Schwalbe RN Entered By: Karie Schwalbe on 06/13/2022 14:20:49 -------------------------------------------------------------------------------- Wound Assessment Details Patient Name: Date of Service: Walter Walter [name], Walter [name] [facility] T. 06/13/2022 2:00 PM Medical Record Number: [id number] Patient Account Number:  [id number] Date of Birth/Sex: Treating RN: 08-Apr-1966 (56 y.o. Walter Walter [name] Walter [name] Primary Care Breyson Kelm: Walter Walter [name] Walter [name] Other Clinician: Referring Simrah Chatham: Treating Arkie Tagliaferro/Extender: Walter Walter [name] Walter [name] in Treatment: 1 Wound Status Wound Number: 5 Primary Etiology: Venous Leg Ulcer Wound Location: Right, Anterior Lower Leg Wound Status: Open Wounding Event: Gradually Appeared Comorbid History: Gout Date Acquired: 06/07/2022 Weeks Of Treatment: 0 Clustered Wound: No Wound Measurements Length: (cm) 9 Width: (cm) 3 Depth: (cm) 0.1 Area: (cm) 21.206 Volume: (cm) 2.121 % Reduction in Area: 0% % Reduction in Volume: 0% Epithelialization: None Tunneling: No Undermining: No Wound Description Classification: Full Thickness Without Exposed Support Structures Wound Margin: Distinct, outline attached Exudate Amount: Large Exudate Type: Serosanguineous Exudate Color: red, brown Foul Odor After Cleansing: No Slough/Fibrino Yes Wound Bed Granulation Amount: None Present (0%) Exposed Structure Necrotic Amount: Large (67-100%) Fascia Exposed: No Necrotic Quality: Adherent Slough Fat Layer (Subcutaneous Tissue) Exposed: Yes Tendon Exposed: No Muscle Exposed: No Joint Exposed: No Bone Exposed: No Treatment Notes Wound #5 (Lower Leg) Wound Laterality: Right, Anterior Cleanser Soap and Water Discharge Instruction: May shower and wash wound with dial antibacterial soap and water prior to dressing change. Wound Cleanser Discharge Instruction: Cleanse the wound with wound cleanser prior to applying a clean dressing using gauze sponges, not tissue or cotton balls. Peri-Wound Care Topical Triamcinolone Discharge Instruction: Apply Triamcinolone as directed Primary Dressing KerraCel Ag Gelling Fiber Dressing, 4x5 in (silver alginate) Discharge Instruction: Apply silver alginate to wound bed as instructed Santyl Ointment Discharge Instruction: Apply nickel  thick amount to wound bed as instructed Secondary Dressing ABD Pad, 8x10 Discharge Instruction: Apply over primary dressing as directed. Zetuvit Plus 4x8 in Discharge Instruction: Apply over primary dressing as directed. Secured With Compression Wrap ThreePress (3 layer compression wrap) Discharge Instruction: Apply three layer compression as directed. Stockinette Compression Stockings Facilities manager) Signed: 06/13/2022 6:08:30 PM By: Karie Schwalbe RN Entered By: Karie Schwalbe on 06/13/2022 14:21:12 -------------------------------------------------------------------------------- Wound Assessment Details Patient Name: Date of Service: Walter Walter [name],  RA [facility] T. 06/13/2022 2:00 PM Medical Record Number: [id number] Patient Account Number: [id number] Date of Birth/Sex: Treating RN: 1966-10-14 (56 y.o. Walter Walter [name] Walter [name] Primary Care Gilles Trimpe: Walter Walter [name] Walter [name] Other Clinician: Referring Ebony Yorio: Treating Quadry Kampa/Extender: Walter Walter [name] Walter [name] in Treatment: 1 Wound Status Wound Number: 6 Primary Etiology: Venous Leg Ulcer Wound Location: Left, Anterior Lower Leg Wound Status: Open Wounding Event: Gradually Appeared Comorbid History: Gout Date Acquired: 06/07/2022 Weeks Of Treatment: 0 Clustered Wound: No Wound Measurements Length: (cm) 5 Width: (cm) 5 Depth: (cm) 0.1 Area: (cm) 19.635 Volume: (cm) 1.963 % Reduction in Area: 0% % Reduction in Volume: 0% Epithelialization: None Tunneling: No Undermining: No Wound Description Classification: Full Thickness Without Exposed Support Structu Wound Margin: Distinct, outline attached Exudate Amount: Large Exudate Type: Serosanguineous Exudate Color: red, brown res Foul Odor After Cleansing: No Slough/Fibrino Yes Wound Bed Granulation Amount: Medium (34-66%) Exposed Structure Granulation Quality: Red Fascia Exposed: No Necrotic Amount: Medium (34-66%) Fat Layer (Subcutaneous Tissue)  Exposed: Yes Necrotic Quality: Adherent Slough Tendon Exposed: No Muscle Exposed: No Joint Exposed: No Bone Exposed: No Treatment Notes Wound #6 (Lower Leg) Wound Laterality: Left, Anterior Cleanser Soap and Water Discharge Instruction: May shower and wash wound with dial antibacterial soap and water prior to dressing change. Wound Cleanser Discharge Instruction: Cleanse the wound with wound cleanser prior to applying a clean dressing using gauze sponges, not tissue or cotton balls. Peri-Wound Care Topical Triamcinolone Discharge Instruction: Apply Triamcinolone as directed Primary Dressing KerraCel Ag Gelling Fiber Dressing, 4x5 in (silver alginate) Discharge Instruction: Apply silver alginate to wound bed as instructed Santyl Ointment Discharge Instruction: Apply nickel thick amount to wound bed as instructed Secondary Dressing ABD Pad, 8x10 Discharge Instruction: Apply over primary dressing as directed. Zetuvit Plus 4x8 in Discharge Instruction: Apply over primary dressing as directed. Secured With Compression Wrap ThreePress (3 layer compression wrap) Discharge Instruction: Apply three layer compression as directed. Stockinette Compression Stockings Facilities manager) Signed: 06/13/2022 6:08:30 PM By: Karie Schwalbe RN Entered By: Karie Schwalbe on 06/13/2022 14:21:37 -------------------------------------------------------------------------------- Vitals Details Patient Name: Date of Service: Walter Walter [name], Walter [name] [facility] T. 06/13/2022 2:00 PM Medical Record Number: [id number] Patient Account Number: [id number] Date of Birth/Sex: Treating RN: [birth year] (56 y.o. Walter Walter [name] Walter [name] Primary Care Rayshaun Needle: Walter Walter [name] Walter [name] Other Clinician: Referring Shaylon Aden: Treating Dallys Nowakowski/Extender: Walter Walter [name] Walter [name] in Treatment: 1 Vital Signs Time Taken: 14:19 Temperature (F): 97.9 Height (in): 75 Pulse (bpm): 62 Weight (lbs): 325 Respiratory Rate  (breaths/min): 18 Body Mass Index (BMI): 40.6 Blood Pressure (mmHg): 152/79 Reference Range: 80 - 120 mg / dl Electronic Signature(s) Signed: 06/13/2022 6:08:30 PM By: Karie Schwalbe RN Entered By: Karie Schwalbe on 06/13/2022 14:20:20

## 2022-06-14 NOTE — Progress Notes (Signed)
GROVE DEFINA ([id number]) , . Visit Report for 06/13/2022 SuperBill Details Patient Name: Date of Service: KYRO, JOSWICK [facility] T. 06/13/2022 Medical Record Number: [id number] Patient Account Number: [id number] Date of Birth/Sex: Treating RN: [birth year] (56 y.o. 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 Electronic Signature(s) Signed: 06/13/2022 6:08:30 PM By: Karie Schwalbe RN Signed: 06/14/2022 9:16:21 AM By: Duanne Guess MD FACS Entered By: Karie Schwalbe on 06/13/2022 18:07:26

## 2022-06-19 ENCOUNTER — Encounter (HOSPITAL_BASED_OUTPATIENT_CLINIC_OR_DEPARTMENT_OTHER): Payer: BC Managed Care – PPO | Attending: General Surgery | Admitting: General Surgery

## 2022-06-19 NOTE — Progress Notes (Addendum)
Walter [name] ([id number]) , . Visit Report for 06/19/2022 Chief Complaint Document Details Patient Name: Date of Service: Walter [name], Walter [name] [facility] T. 06/19/2022 2:30 PM Medical Record Number: [id number] Patient Account Number: [id number] Date of Birth/Sex: Treating RN: 11-25-1965 (56 y.o. [name] Primary Care Provider: Jenna Luo Other Clinician: Referring Provider: Treating Provider/Extender: Mertha Finders in Treatment: 2 Information Obtained from: Patient Chief Complaint 06/01/2022; patient is here for review of predominantly wounds on his bilateral lower legs and also bilateral dorsal feet Electronic Signature(s) Signed: 06/19/2022 3:38:16 PM By: Fredirick Maudlin MD FACS Entered By: Fredirick Maudlin on 06/19/2022 15:38:16 -------------------------------------------------------------------------------- HPI Details Patient Name: Date of Service: Walter [name], Walter [name] [facility] T. 06/19/2022 2:30 PM Medical Record Number: [id number] Patient Account Number: [id number] Date of Birth/Sex: Treating RN: [birth year] (56 y.o. [name] Primary Care Provider: Jenna Luo Other Clinician: Referring Provider: Treating Provider/Extender: Mertha Finders in Treatment: 2 History of Present Illness HPI Description: ADMISSION 06/01/2022 This is a 56 year old man who was problem began towards the end of March. He developed acute onset of of worsening painful rash involving his bilateral legs but also both arms and his back. At that time he was originally seen in urgent care this have been going on for a week. He also had marked bilateral lower extremity edema. 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 He was started on prednisone 60 mg. Had extensive blood work and revealing a CRP of 90 but ANCA negative. Hepatitis screen  negative. He saw a rheumatology Dr. Kathlene November at [facility]. He has him on Lasix 40 and prednisone 40 although the patient tells me that this is coming to an end. 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 He has not been able to work this week. He describes pain at 10 out of 10 made worse by putting any pressure on the back of his legs. He has been sleeping in a chair with his legs down Historically he may have had background lymphedema in his legs although I am really not certain about this. Past medical history includes a history of COPD/hypoxia. He is not a diabetic.He is a non-smoker 06/07/2022: This is my first encounter with the patient. He is visibly uncomfortable and complains of 10 out of 10 pain. He has been taking narcotics as prescribed by his primary care provider. He declines any physical examination beyond visual inspection. He has superficial wounds with slough accumulation on all the surfaces. 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 The circumference of his calves has gone down by 10 cm since being in compressive wraps. 06/19/2022: He continues to complain of 10 out of 10 pain. His drainage and wounds have a strong fungal odor to them today. He still has slough accumulation within all of the divots on his wounds. He refuses debridement. Electronic Signature(s) Signed: 06/19/2022 3:39:19 PM By: Fredirick Maudlin MD FACS Entered By: Fredirick Maudlin on 06/19/2022 15:39:19 -------------------------------------------------------------------------------- Physical Exam Details Patient Name: Date of Service: Walter [name], Walter [name] [facility] T. 06/19/2022 2:30 PM Medical Record Number: [id number] Patient Account Number: [id number] Date of Birth/Sex: Treating RN: 01-01-1966 (56 y.o. [name] Primary Care Provider: Jenna Luo Other Clinician: Referring Provider: Treating Provider/Extender: Mertha Finders in Treatment: 2 Constitutional . .  Morbidly obese. Visibly uncomfortable. Respiratory Normal work of breathing on room air.. Notes 06/19/2022: He continues to complain of 10  out of 10 pain. His drainage and wounds have a strong fungal odor to them today. He still has slough accumulation within all of the divots on his wounds. He refuses debridement. Electronic Signature(s) Signed: 06/19/2022 3:40:27 PM By: Duanne Guess MD FACS Entered By: Duanne Guess on 06/19/2022 15:40:27 -------------------------------------------------------------------------------- Physician Orders Details Patient Name: Date of Service: Walter [name], Walter [name] [facility] T. 06/19/2022 2:30 PM Medical Record Number: [id number] Patient Account Number: [id number] Date of Birth/Sex: Treating RN: 20-May-1966 (56 y.o. Dianna Limbo Primary Care Provider: Lynnea Ferrier Other Clinician: Referring Provider: Treating Provider/Extender: Suzan Garibaldi in Treatment: 2 Verbal / Phone Orders: No Diagnosis Coding ICD-10 Coding Code Description [phone] Non-pressure chronic ulcer of other part of left lower leg with other specified severity L97.818 Non-pressure chronic ulcer of other part of right lower leg with other specified severity L95.8 Other vasculitis limited to the skin L97.528 Non-pressure chronic ulcer of other part of left foot with other specified severity L97.518 Non-pressure chronic ulcer of other part of right foot with other specified severity Follow-up Appointments ppointment in 1 week. - Dr. Lady Gary Room 3 Tuesday 8/15 at 2:30pm Return A Nurse Visit: - Room 3 Tuesday 06/26/22 at 2pm Bathing/ Shower/ Hygiene Other Bathing/Shower/Hygiene Orders/Instructions: - Please keep leg wraps dry-sponge bathe for now Edema Control - Lymphedema / SCD / Other Bilateral Lower Extremities Elevate legs to the level of the heart or above for 30 minutes daily and/or when sitting, a frequency of: - As much as possible-Elevate throughout the  day. void standing for long periods of time. - Frequently throughout the day A Wound Treatment Wound #1 - Lower Leg Wound Laterality: Left, Posterior Cleanser: Soap and Water 1 x Per Week/30 Days Discharge Instructions: May shower and wash wound with dial antibacterial soap and water prior to dressing change. Cleanser: Wound Cleanser 1 x Per Week/30 Days Discharge Instructions: Cleanse the wound with wound cleanser prior to applying a clean dressing using gauze sponges, not tissue or cotton balls. Topical: Ketoconazole Cream 2% 1 x Per Week/30 Days Discharge Instructions: Apply Ketoconazole as directed Topical: Triamcinolone 1 x Per Week/30 Days Discharge Instructions: Apply Triamcinolone as directed Prim Dressing: KerraCel Ag Gelling Fiber Dressing, 4x5 in (silver alginate) 1 x Per Week/30 Days ary Discharge Instructions: Apply silver alginate to wound bed as instructed Secondary Dressing: ABD Pad, 8x10 1 x Per Week/30 Days Discharge Instructions: Apply over primary dressing as directed. Secondary Dressing: Zetuvit Plus 4x8 in 1 x Per Week/30 Days Discharge Instructions: Apply over primary dressing as directed. Compression Wrap: ThreePress (3 layer compression wrap) 1 x Per Week/30 Days Discharge Instructions: Apply three layer compression as directed. Compression Wrap: Stockinette 1 x Per Week/30 Days Wound #2 - Lower Leg Wound Laterality: Right, Posterior Cleanser: Soap and Water 1 x Per Week/30 Days Discharge Instructions: May shower and wash wound with dial antibacterial soap and water prior to dressing change. Cleanser: Wound Cleanser 1 x Per Week/30 Days Discharge Instructions: Cleanse the wound with wound cleanser prior to applying a clean dressing using gauze sponges, not tissue or cotton balls. Topical: Ketoconazole Cream 2% 1 x Per Week/30 Days Discharge Instructions: Apply Ketoconazole as directed Topical: Triamcinolone 1 x Per Week/30 Days Discharge Instructions: Apply  Triamcinolone as directed Prim Dressing: KerraCel Ag Gelling Fiber Dressing, 4x5 in (silver alginate) 1 x Per Week/30 Days ary Discharge Instructions: Apply silver alginate to wound bed as instructed Secondary Dressing: ABD Pad, 8x10 1 x Per Week/30 Days Discharge Instructions: Apply over primary dressing as directed.  Secondary Dressing: Zetuvit Plus 4x8 in 1 x Per Week/30 Days Discharge Instructions: Apply over primary dressing as directed. Compression Wrap: ThreePress (3 layer compression wrap) 1 x Per Week/30 Days Discharge Instructions: Apply three layer compression as directed. Compression Wrap: Stockinette 1 x Per Week/30 Days Wound #5 - Lower Leg Wound Laterality: Right, Anterior Cleanser: Soap and Water 1 x Per Week/30 Days Discharge Instructions: May shower and wash wound with dial antibacterial soap and water prior to dressing change. Cleanser: Wound Cleanser 1 x Per Week/30 Days Discharge Instructions: Cleanse the wound with wound cleanser prior to applying a clean dressing using gauze sponges, not tissue or cotton balls. Topical: Ketoconazole Cream 2% 1 x Per Week/30 Days Discharge Instructions: Apply Ketoconazole as directed Topical: Triamcinolone 1 x Per Week/30 Days Discharge Instructions: Apply Triamcinolone as directed Prim Dressing: KerraCel Ag Gelling Fiber Dressing, 4x5 in (silver alginate) 1 x Per Week/30 Days ary Discharge Instructions: Apply silver alginate to wound bed as instructed Secondary Dressing: ABD Pad, 8x10 1 x Per Week/30 Days Discharge Instructions: Apply over primary dressing as directed. Secondary Dressing: Zetuvit Plus 4x8 in 1 x Per Week/30 Days Discharge Instructions: Apply over primary dressing as directed. Compression Wrap: ThreePress (3 layer compression wrap) 1 x Per Week/30 Days Discharge Instructions: Apply three layer compression as directed. Compression Wrap: Stockinette 1 x Per Week/30 Days Wound #6 - Lower Leg Wound Laterality: Left,  Anterior Cleanser: Soap and Water 1 x Per Week/30 Days Discharge Instructions: May shower and wash wound with dial antibacterial soap and water prior to dressing change. Cleanser: Wound Cleanser 1 x Per Week/30 Days Discharge Instructions: Cleanse the wound with wound cleanser prior to applying a clean dressing using gauze sponges, not tissue or cotton balls. Topical: Ketoconazole Cream 2% 1 x Per Week/30 Days Discharge Instructions: Apply Ketoconazole as directed Topical: Triamcinolone 1 x Per Week/30 Days Discharge Instructions: Apply Triamcinolone as directed Prim Dressing: KerraCel Ag Gelling Fiber Dressing, 4x5 in (silver alginate) 1 x Per Week/30 Days ary Discharge Instructions: Apply silver alginate to wound bed as instructed Secondary Dressing: ABD Pad, 8x10 1 x Per Week/30 Days Discharge Instructions: Apply over primary dressing as directed. Secondary Dressing: Zetuvit Plus 4x8 in 1 x Per Week/30 Days Discharge Instructions: Apply over primary dressing as directed. Compression Wrap: ThreePress (3 layer compression wrap) 1 x Per Week/30 Days Discharge Instructions: Apply three layer compression as directed. Compression Wrap: Stockinette 1 x Per Week/30 Days Electronic Signature(s) Signed: 06/19/2022 3:49:07 PM By: Duanne Guess MD FACS Signed: 06/19/2022 6:11:05 PM By: Karie Schwalbe RN Previous Signature: 06/19/2022 3:40:37 PM Version By: Duanne Guess MD FACS Entered By: Karie Schwalbe on 06/19/2022 15:41:22 -------------------------------------------------------------------------------- Problem List Details Patient Name: Date of Service: Walter [name], Walter [name] [facility] T. 06/19/2022 2:30 PM Medical Record Number: [id number] Patient Account Number: [id number] Date of Birth/Sex: Treating RN: Feb 20, 1966 (55 y.o. Dianna Limbo Primary Care Provider: Lynnea Ferrier Other Clinician: Referring Provider: Treating Provider/Extender: Suzan Garibaldi in Treatment:  2 Active Problems ICD-10 Encounter Code Description Active Date MDM Diagnosis L97.828 Non-pressure chronic ulcer of other part of left lower leg with other specified 06/01/2022 No Yes severity L97.818 Non-pressure chronic ulcer of other part of right lower leg with other specified 06/01/2022 No Yes severity L95.8 Other vasculitis limited to the skin 06/01/2022 No Yes L97.528 Non-pressure chronic ulcer of other part of left foot with other specified 06/01/2022 No Yes severity L97.518 Non-pressure chronic ulcer of other part of right foot with other specified 06/01/2022 No Yes severity  Inactive Problems Resolved Problems Electronic Signature(s) Signed: 06/19/2022 3:38:02 PM By: Fredirick Maudlin MD FACS Entered By: Fredirick Maudlin on 06/19/2022 15:38:02 -------------------------------------------------------------------------------- Progress Note Details Patient Name: Date of Service: Walter [name], Walter [name] [facility] T. 06/19/2022 2:30 PM Medical Record Number: [id number] Patient Account Number: [id number] Date of Birth/Sex: Treating RN: 04-Feb-1966 (56 y.o. [name] Primary Care Provider: Jenna Luo Other Clinician: Referring Provider: Treating Provider/Extender: Mertha Finders in Treatment: 2 Subjective Chief Complaint Information obtained from Patient 06/01/2022; patient is here for review of predominantly wounds on his bilateral lower legs and also bilateral dorsal feet History of Present Illness (HPI) ADMISSION 06/01/2022 This is a 56 year old man who was problem began towards the end of March. He developed acute onset of of worsening painful rash involving his bilateral legs but also both arms and his back. At that time he was originally seen in urgent care this have been going on for a week. He also had marked bilateral lower extremity edema. He went on to have a fairly nice evaluation by his primary doctor including a punch biopsy that diagnosed leukocytoclastic  vasculitis which was already the clinical suspicion. He was started on prednisone 60 mg. Had extensive blood work and revealing a CRP of 90 but ANCA negative. Hepatitis screen negative. He saw a rheumatology Dr. Kathlene November at [facility]. He has him on Lasix 40 and prednisone 40 although the patient tells me that this is coming to an end. 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 He has not been able to work this week. He describes pain at 10 out of 10 made worse by putting any pressure on the back of his legs. He has been sleeping in a chair with his legs down Historically he may have had background lymphedema in his legs although I am really not certain about this. Past medical history includes a history of COPD/hypoxia. He is not a diabetic.He is a non-smoker 06/07/2022: This is my first encounter with the patient. He is visibly uncomfortable and complains of 10 out of 10 pain. He has been taking narcotics as prescribed by his primary care provider. He declines any physical examination beyond visual inspection. He has superficial wounds with slough accumulation on all the surfaces. 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 The circumference of his calves has gone down by 10 cm since being in compressive wraps. 06/19/2022: He continues to complain of 10 out of 10 pain. His drainage and wounds have a strong fungal odor to them today. He still has slough accumulation within all of the divots on his wounds. He refuses debridement. Patient History Information obtained from Patient. Family History Unknown History. Social History Never smoker, Marital Status - Married, Alcohol Use - Never, Drug Use - No History, Caffeine Use - Daily. Medical History Musculoskeletal Patient has history of Gout Medical A Surgical History Notes nd Endocrine Prediabetes Genitourinary [id number] infections Objective Constitutional Morbidly obese. Visibly uncomfortable. Vitals Time Taken: 2:36 PM,  Height: 75 in, Weight: 325 lbs, BMI: 40.6, Temperature: 99.6 F, Respiratory Rate: 20 breaths/min. Respiratory Normal work of breathing on room air.. General Notes: 06/19/2022: He continues to complain of 10 out of 10 pain. His drainage and wounds have a strong fungal odor to them today. He still has slough accumulation within all of the divots on his wounds. He refuses debridement. Integumentary (Hair, Skin) Wound #1 status is Open. Original cause of wound was Gradually Appeared. The date acquired was: 02/10/2022. The wound has been in treatment 2 weeks. The  wound is located on the Left,Posterior Lower Leg. The wound measures 18cm length x 12cm width x 0.1cm depth; 169.646cm^2 area and 16.965cm^3 volume. There is Fat Layer (Subcutaneous Tissue) exposed. There is no tunneling or undermining noted. There is a large amount of serous drainage noted. There is small (1-33%) pink granulation within the wound bed. There is a large (67-100%) amount of necrotic tissue within the wound bed including Eschar and Adherent Slough. Wound #2 status is Open. Original cause of wound was Gradually Appeared. The date acquired was: 02/10/2022. The wound has been in treatment 2 weeks. The wound is located on the Right,Posterior Lower Leg. The wound measures 24cm length x 11cm width x 0.1cm depth; 207.345cm^2 area and 20.735cm^3 volume. There is Fat Layer (Subcutaneous Tissue) exposed. There is no tunneling or undermining noted. There is a large amount of serous drainage noted. There is small (1-33%) red granulation within the wound bed. There is a large (67-100%) amount of necrotic tissue within the wound bed including Eschar and Adherent Slough. Wound #5 status is Open. Original cause of wound was Gradually Appeared. The date acquired was: 06/07/2022. The wound has been in treatment 1 weeks. The wound is located on the Right,Anterior Lower Leg. The wound measures 9cm length x 1cm width x 0.1cm depth; 7.069cm^2 area and  0.707cm^3 volume. There is Fat Layer (Subcutaneous Tissue) exposed. There is no tunneling or undermining noted. There is a large amount of serosanguineous drainage noted. The wound margin is distinct with the outline attached to the wound base. There is small (1-33%) pink granulation within the wound bed. There is a large (67-100%) amount of necrotic tissue within the wound bed including Eschar and Adherent Slough. Wound #6 status is Open. Original cause of wound was Gradually Appeared. The date acquired was: 06/07/2022. The wound has been in treatment 1 weeks. The wound is located on the Left,Anterior Lower Leg. The wound measures 10cm length x 3cm width x 0.1cm depth; 23.562cm^2 area and 2.356cm^3 volume. There is Fat Layer (Subcutaneous Tissue) exposed. There is no tunneling or undermining noted. There is a large amount of serosanguineous drainage noted. The wound margin is distinct with the outline attached to the wound base. There is small (1-33%) red granulation within the wound bed. There is a large (67-100%) amount of necrotic tissue within the wound bed including Eschar and Adherent Slough. 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 His drainage and wounds have a strong fungal odor to them today. He still has slough accumulation within all of the divots on his wounds. He refuses debridement. Based upon the odor I am appreciating, we are going to empirically apply ketoconazole to his wounds. Ideally, I would be able to take a wound culture but he cannot  tolerate even a light gauze brushing over the surface of his wounds. He does have an upcoming appointment with rheumatology. Hopefully they will have something to contribute that will work towards alleviating his pain so that we can properly manage his wounds. He will have a nurse visit in 1 week and then see me in 2 weeks. Electronic Signature(s)  Signed: 06/19/2022 3:42:35 PM By: Fredirick Maudlin MD FACS Signed: 06/19/2022 3:42:35 PM By: Fredirick Maudlin MD FACS Entered By: Fredirick Maudlin on 06/19/2022 15:42:35 -------------------------------------------------------------------------------- HxROS Details Patient Name: Date of Service: Walter [name], Walter [name] [facility] T. 06/19/2022 2:30 PM Medical Record Number: [id number] Patient Account Number: [id number] Date of Birth/Sex: Treating RN: 09-06-1966 (56 y.o. [name] Primary Care Provider: Jenna Luo Other Clinician: Referring Provider: Treating Provider/Extender: Mertha Finders in Treatment: 2 Information Obtained From Patient Endocrine Medical History: Past Medical History Notes: Prediabetes Genitourinary Medical History: Past Medical History Notes: [id number] infections Musculoskeletal Medical History: Positive for: Gout Immunizations Pneumococcal Vaccine: Received Pneumococcal Vaccination: Yes Received Pneumococcal Vaccination On or After 60th Birthday: No Implantable Devices None Family and Social History Unknown History: Yes; Never smoker; Marital Status - Married; Alcohol Use: Never; Drug Use: No History; Caffeine Use: Daily; Financial Concerns: No; Food, Clothing or Shelter Needs: No; Support System Lacking: No; Transportation Concerns: No Engineer, maintenance) Signed: 06/19/2022 3:49:07 PM By: Fredirick Maudlin MD FACS Signed: 06/19/2022 6:11:05 PM By: Dellie Catholic RN Entered By: Fredirick Maudlin on 06/19/2022  15:39:34 -------------------------------------------------------------------------------- [place] Details Patient Name: Date of Service: Walter [name], Walter [name] [facility] T. 06/19/2022 Medical Record Number: [id number] Patient Account Number: [id number] Date of Birth/Sex: Treating RN: 1966-09-29 (56 y.o. [name] Primary Care Provider: Jenna Luo Other Clinician: Referring Provider: Treating Provider/Extender: Mertha Finders in Treatment: 2 Diagnosis Coding ICD-10 Codes Code Description [phone] Non-pressure chronic ulcer of other part of left lower leg with other specified severity L97.818 Non-pressure chronic ulcer of other part of right lower leg with other specified severity L95.8 Other vasculitis limited to the skin L97.528 Non-pressure chronic ulcer of other part of left foot with other specified severity L97.518 Non-pressure chronic ulcer of other part of right foot with other specified severity Physician Procedures : CPT4 Code Description Modifier [id number] - WC PHYS LEVEL 4 - EST PT ICD-10 Diagnosis Description L97.828 Non-pressure chronic ulcer of other part of left lower leg with other specified severity L97.818 Non-pressure chronic ulcer of other part of  right lower leg with other specified severity L95.8 Other vasculitis limited to the skin L97.528 Non-pressure chronic ulcer of other part of left foot with other specified severity Quantity: 1 Electronic Signature(s) Signed: 06/19/2022 3:42:52 PM By: Fredirick Maudlin MD FACS Entered By: Fredirick Maudlin on 06/19/2022 15:42:52

## 2022-06-19 NOTE — Progress Notes (Signed)
Walter [name] ([id number]) , . Visit Report for 06/19/2022 Arrival Information Details Patient Name: Date of Service: Walter [name], Walter [name] [facility] T. 06/19/2022 2:30 PM Medical Record Number: [id number] Patient Account Number: [id number] Date of Birth/Sex: Treating RN: 11-01-1966 (56 y.o. Dianna Limbo Primary Care Maryetta Shafer: Lynnea Ferrier Other Clinician: Referring Wendee Hata: Treating Wisdom Seybold/Extender: Suzan Garibaldi in Treatment: 2 Visit Information History Since Last Visit Added or deleted any medications: No Patient Arrived: Ambulatory Any new allergies or adverse reactions: No Arrival Time: 14:29 Had a fall or experienced change in No Accompanied By: self activities of daily living that may affect Transfer Assistance: None risk of falls: Patient Identification Verified: Yes Signs or symptoms of abuse/neglect since last visito No Patient Requires Transmission-Based Precautions: No Hospitalized since last visit: No Patient Has Alerts: No Implantable device outside of the clinic excluding No cellular tissue based products placed in the center since last visit: Has Dressing in Place as Prescribed: Yes Has Compression in Place as Prescribed: Yes Pain Present Now: Yes Electronic Signature(s) Signed: 06/19/2022 6:11:05 PM By: Karie Schwalbe RN Entered By: Karie Schwalbe on 06/19/2022 14:38:17 -------------------------------------------------------------------------------- Compression Therapy Details Patient Name: Date of Service: Walter [name], Walter [name] [facility] T. 06/19/2022 2:30 PM Medical Record Number: [id number] Patient Account Number: [id number] Date of Birth/Sex: Treating RN: 03-13-1966 (56 y.o. Dianna Limbo Primary Care Jovan Schickling: Lynnea Ferrier Other Clinician: Referring Harleyquinn Gasser: Treating Johnluke Haugen/Extender: Suzan Garibaldi in Treatment: 2 Compression Therapy Performed for Wound Assessment: Wound #1 Left,Posterior Lower Leg Performed By:  Clinician Karie Schwalbe, RN Compression Type: Three Layer Post Procedure Diagnosis Same as Pre-procedure Electronic Signature(s) Signed: 06/19/2022 6:11:05 PM By: Karie Schwalbe RN Entered By: Karie Schwalbe on 06/19/2022 18:10:14 -------------------------------------------------------------------------------- Compression Therapy Details Patient Name: Date of Service: Walter [name], Walter [name] [facility] T. 06/19/2022 2:30 PM Medical Record Number: [id number] Patient Account Number: [id number] Date of Birth/Sex: Treating RN: December 14, 1965 (56 y.o. Dianna Limbo Primary Care Javarri Segal: Lynnea Ferrier Other Clinician: Referring Dakhari Zuver: Treating Kaiah Hosea/Extender: Suzan Garibaldi in Treatment: 2 Compression Therapy Performed for Wound Assessment: Wound #2 Right,Posterior Lower Leg Performed By: Clinician Karie Schwalbe, RN Compression Type: Three Layer Post Procedure Diagnosis Same as Pre-procedure Electronic Signature(s) Signed: 06/19/2022 6:11:05 PM By: Karie Schwalbe RN Entered By: Karie Schwalbe on 06/19/2022 18:10:14 -------------------------------------------------------------------------------- Compression Therapy Details Patient Name: Date of Service: Walter [name], Walter [name] [facility] T. 06/19/2022 2:30 PM Medical Record Number: [id number] Patient Account Number: [id number] Date of Birth/Sex: Treating RN: 11-26-1965 (56 y.o. Dianna Limbo Primary Care Clary Meeker: Lynnea Ferrier Other Clinician: Referring Lailana Shira: Treating Lynze Reddy/Extender: Suzan Garibaldi in Treatment: 2 Compression Therapy Performed for Wound Assessment: Wound #5 Right,Anterior Lower Leg Performed By: Clinician Karie Schwalbe, RN Compression Type: Three Layer Post Procedure Diagnosis Same as Pre-procedure Electronic Signature(s) Signed: 06/19/2022 6:11:05 PM By: Karie Schwalbe RN Entered By: Karie Schwalbe on 06/19/2022  18:10:14 -------------------------------------------------------------------------------- Compression Therapy Details Patient Name: Date of Service: Walter [name], Walter [name] [facility] T. 06/19/2022 2:30 PM Medical Record Number: [id number] Patient Account Number: [id number] Date of Birth/Sex: Treating RN: [birth year] (55 y.o. Dianna Limbo Primary Care Damone Fancher: Lynnea Ferrier Other Clinician: Referring Rhya Shan: Treating Trestin Vences/Extender: Suzan Garibaldi in Treatment: 2 Compression Therapy Performed for Wound Assessment: Wound #6 Left,Anterior Lower Leg Performed By: Clinician Karie Schwalbe, RN Compression Type: Three Layer Post Procedure Diagnosis Same as Pre-procedure Electronic Signature(s) Signed: 06/19/2022 6:11:05 PM By: Karie Schwalbe RN Entered By: Karie Schwalbe on 06/19/2022 18:10:14 -------------------------------------------------------------------------------- Encounter Discharge Information Details Patient Name: Date of Service: Walter [name] [facility]  T. 06/19/2022 2:30 PM Medical Record Number: [id number] Patient Account Number: [id number] Date of Birth/Sex: Treating RN: 10/28/1966 (56 y.o. Dianna Limbo Primary Care Rikki Smestad: Lynnea Ferrier Other Clinician: Referring Reighlyn Elmes: Treating Kaylenn Civil/Extender: Suzan Garibaldi in Treatment: 2 Encounter Discharge Information Items Discharge Condition: Stable Ambulatory Status: Ambulatory Discharge Destination: Home Transportation: Private Auto Accompanied By: self Schedule Follow-up Appointment: Yes Clinical Summary of Care: Patient Declined Electronic Signature(s) Signed: 06/19/2022 6:11:05 PM By: Karie Schwalbe RN Entered By: Karie Schwalbe on 06/19/2022 18:10:45 -------------------------------------------------------------------------------- Lower Extremity Assessment Details Patient Name: Date of Service: Walter [name], Walter [name] [facility] T. 06/19/2022 2:30 PM Medical Record Number:  [id number] Patient Account Number: [id number] Date of Birth/Sex: Treating RN: [birth year] (56 y.o. Dianna Limbo Primary Care Dellene Mcgroarty: Lynnea Ferrier Other Clinician: Referring Lyell Clugston: Treating Reegan Mctighe/Extender: Suzan Garibaldi in Treatment: 2 Edema Assessment Assessed: [Left: No] [Right: No] E[Left: dema] [Right: :] Calf Left: Right: Point of Measurement: 33 cm From Medial Instep 46.4 cm 48.5 cm Ankle Left: Right: Point of Measurement: 11 cm From Medial Instep 31 cm 31.4 cm Electronic Signature(s) Signed: 06/19/2022 6:11:05 PM By: Karie Schwalbe RN Entered By: Karie Schwalbe on 06/19/2022 15:18:04 -------------------------------------------------------------------------------- Multi Wound Chart Details Patient Name: Date of Service: Walter [name], Walter [name] [facility] T. 06/19/2022 2:30 PM Medical Record Number: [id number] Patient Account Number: [id number] Date of Birth/Sex: Treating RN: 06/17/1966 (56 y.o. Dianna Limbo Primary Care Zada Haser: Lynnea Ferrier Other Clinician: Referring Carole Deere: Treating Anwita Mencer/Extender: Suzan Garibaldi in Treatment: 2 Vital Signs Height(in): 75 Pulse(bpm): Weight(lbs): 325 Blood Pressure(mmHg): Body Mass Index(BMI): 40.6 Temperature(F): 99.6 Respiratory Rate(breaths/min): 20 Photos: Left, Posterior Lower Leg Right, Posterior Lower Leg Right, Anterior Lower Leg Wound Location: Gradually Appeared Gradually Appeared Gradually Appeared Wounding Event: Vasculitis Vasculitis Venous Leg Ulcer Primary Etiology: Gout Gout Gout Comorbid History: 02/10/2022 02/10/2022 06/07/2022 Date Acquired: Weeks of Treatment: Open Open Open Wound Status: No No No Wound Recurrence: Yes Yes No Clustered Wound: 18x12x0.1 24x11x0.1 9x1x0.1 Measurements L x W x D (cm) 169.646 207.345 7.069 A (cm) : rea 16.965 20.735 0.707 Volume (cm) : 75.30% 69.80% 66.70% % Reduction in A rea: 87.70% 84.90%  66.70% % Reduction in Volume: Full Thickness Without Exposed Full Thickness Without Exposed Full Thickness Without Exposed Classification: Support Structures Support Structures Support Structures Large Large Large Exudate A mount: Serous Serous Serosanguineous Exudate Type: amber amber red, brown Exudate Color: N/A N/A Distinct, outline attached Wound Margin: Small (1-33%) Small (1-33%) Small (1-33%) Granulation Amount: Pink Red Pink Granulation Quality: Large (67-100%) Large (67-100%) Large (67-100%) Necrotic Amount: Eschar, Adherent Slough Eschar, Adherent Slough Eschar, Adherent Slough Necrotic Tissue: Fat Layer (Subcutaneous Tissue): Yes Fat Layer (Subcutaneous Tissue): Yes Fat Layer (Subcutaneous Tissue): Yes Exposed Structures: Fascia: No Fascia: No Fascia: No Tendon: No Tendon: No Tendon: No Muscle: No Muscle: No Muscle: No Joint: No Joint: No Joint: No Bone: No Bone: No Bone: No None None None Epithelialization: Wound Number: 6 N/A N/A Photos: N/A N/A Left, Anterior Lower Leg N/A N/A Wound Location: Gradually Appeared N/A N/A Wounding Event: Venous Leg Ulcer N/A N/A Primary Etiology: Gout N/A N/A Comorbid History: 06/07/2022 N/A N/A Date Acquired: 1 N/A N/A Weeks of Treatment: Open N/A N/A Wound Status: No N/A N/A Wound Recurrence: No N/A N/A Clustered Wound: 10x3x0.1 N/A N/A Measurements L x W x D (cm) 23.562 N/A N/A A (cm) : rea 2.356 N/A N/A Volume (cm) : -20.00% N/A N/A % Reduction in Area: -20.00% N/A N/A % Reduction in Volume: Full Thickness  Without Exposed N/A N/A Classification: Support Structures Large N/A N/A Exudate A mount: Serosanguineous N/A N/A Exudate Type: red, brown N/A N/A Exudate Color: Distinct, outline attached N/A N/A Wound Margin: Small (1-33%) N/A N/A Granulation Amount: Red N/A N/A Granulation Quality: Large (67-100%) N/A N/A Necrotic Amount: Eschar, Adherent Slough N/A N/A Necrotic  Tissue: Fat Layer (Subcutaneous Tissue): Yes N/A N/A Exposed Structures: Fascia: No Tendon: No Muscle: No Joint: No Bone: No None N/A N/A Epithelialization: Treatment Notes Electronic Signature(s) Signed: 06/19/2022 3:38:10 PM By: Duanne Guess MD FACS Signed: 06/19/2022 6:11:05 PM By: Karie Schwalbe RN Entered By: Duanne Guess on 06/19/2022 15:38:10 -------------------------------------------------------------------------------- Multi-Disciplinary Care Plan Details Patient Name: Date of Service: Walter [name], Walter [name] [facility] T. 06/19/2022 2:30 PM Medical Record Number: [id number] Patient Account Number: [id number] Date of Birth/Sex: Treating RN: Sep 10, 1966 (56 y.o. Dianna Limbo Primary Care Ivylynn Hoppes: Lynnea Ferrier Other Clinician: Referring Oseph Imburgia: Treating Tammatha Cobb/Extender: Suzan Garibaldi in Treatment: 2 Active Inactive Pain, Acute or Chronic Nursing Diagnoses: Pain, acute or chronic: actual or potential Goals: Patient/caregiver will verbalize adequate pain control between visits Date Initiated: 06/01/2022 Target Resolution Date: 08/01/2022 Goal Status: Active Interventions: Provide education on pain management Notes: Electronic Signature(s) Signed: 06/19/2022 6:11:05 PM By: Karie Schwalbe RN Entered By: Karie Schwalbe on 06/19/2022 18:09:27 -------------------------------------------------------------------------------- Pain Assessment Details Patient Name: Date of Service: Walter [name], Walter [name] [facility] T. 06/19/2022 2:30 PM Medical Record Number: [id number] Patient Account Number: [id number] Date of Birth/Sex: Treating RN: 11-14-1966 (56 y.o. Dianna Limbo Primary Care Birdie Beveridge: Lynnea Ferrier Other Clinician: Referring Paitynn Mikus: Treating Chaneka Trefz/Extender: Suzan Garibaldi in Treatment: 2 Active Problems Location of Pain Severity and Description of Pain Patient Has Paino Yes Site Locations Pain Location: Pain in  Ulcers With Dressing Change: Yes Duration of the Pain. Constant / Intermittento Constant Rate the pain. Current Pain Level: 6 Worst Pain Level: 10 Least Pain Level: 3 Tolerable Pain Level: 3 Pain Management and Medication Current Pain Management: Medication: No Cold Application: No Rest: Yes Massage: No Activity: No T.E.N.S.: No Heat Application: No Leg drop or elevation: No Is the Current Pain Management Adequate: Inadequate How does your wound impact your activities of daily livingo Sleep: No Bathing: No Appetite: No Relationship With Others: No Bladder Continence: No Emotions: No Bowel Continence: No Work: No Toileting: No Drive: No Dressing: No Hobbies: No Electronic Signature(s) Signed: 06/19/2022 6:11:05 PM By: Karie Schwalbe RN Entered By: Karie Schwalbe on 06/19/2022 15:17:59 -------------------------------------------------------------------------------- Patient/Caregiver Education Details Patient Name: Date of Service: Walter [name], Walter [name] [facility] T. 8/1/2023andnbsp2:30 PM Medical Record Number: [id number] Patient Account Number: [id number] Date of Birth/Gender: Treating RN: 1966-09-06 (56 y.o. Dianna Limbo Primary Care Physician: Lynnea Ferrier Other Clinician: Referring Physician: Treating Physician/Extender: Suzan Garibaldi in Treatment: 2 Education Assessment Education Provided To: Patient Education Topics Provided Wound/Skin Impairment: Methods: Explain/Verbal Responses: Return demonstration correctly Electronic Signature(s) Signed: 06/19/2022 6:11:05 PM By: Karie Schwalbe RN Entered By: Karie Schwalbe on 06/19/2022 18:09:40 -------------------------------------------------------------------------------- Wound Assessment Details Patient Name: Date of Service: Walter [name], Walter [name] [facility] T. 06/19/2022 2:30 PM Medical Record Number: [id number] Patient Account Number: [id number] Date of Birth/Sex: Treating RN: 1966/07/30 (56 y.o. Dianna Limbo Primary Care Captola Teschner: Lynnea Ferrier Other Clinician: Referring Leandro Berkowitz: Treating Haili Donofrio/Extender: Suzan Garibaldi in Treatment: 2 Wound Status Wound Number: 1 Primary Etiology: Vasculitis Wound Location: Left, Posterior Lower Leg Wound Status: Open Wounding Event: Gradually Appeared Comorbid History: Gout Date Acquired: 02/10/2022 Weeks Of Treatment: 2 Clustered Wound: Yes Photos Wound Measurements Length: (cm) 18 Width: (  cm) 12 Depth: (cm) 0.1 Area: (cm) 169.646 Volume: (cm) 16.965 % Reduction in Area: 75.3% % Reduction in Volume: 87.7% Epithelialization: None Tunneling: No Undermining: No Wound Description Classification: Full Thickness Without Exposed Support Structures Exudate Amount: Large Exudate Type: Serous Exudate Color: amber Foul Odor After Cleansing: No Slough/Fibrino Yes Wound Bed Granulation Amount: Small (1-33%) Exposed Structure Granulation Quality: Pink Fascia Exposed: No Necrotic Amount: Large (67-100%) Fat Layer (Subcutaneous Tissue) Exposed: Yes Necrotic Quality: Eschar, Adherent Slough Tendon Exposed: No Muscle Exposed: No Joint Exposed: No Bone Exposed: No Treatment Notes Wound #1 (Lower Leg) Wound Laterality: Left, Posterior Cleanser Soap and Water Discharge Instruction: May shower and wash wound with dial antibacterial soap and water prior to dressing change. Wound Cleanser Discharge Instruction: Cleanse the wound with wound cleanser prior to applying a clean dressing using gauze sponges, not tissue or cotton balls. Peri-Wound Care Topical Ketoconazole Cream 2% Discharge Instruction: Apply Ketoconazole as directed Triamcinolone Discharge Instruction: Apply Triamcinolone as directed Primary Dressing KerraCel Ag Gelling Fiber Dressing, 4x5 in (silver alginate) Discharge Instruction: Apply silver alginate to wound bed as instructed Secondary Dressing ABD Pad, 8x10 Discharge Instruction:  Apply over primary dressing as directed. Zetuvit Plus 4x8 in Discharge Instruction: Apply over primary dressing as directed. Secured With Compression Wrap ThreePress (3 layer compression wrap) Discharge Instruction: Apply three layer compression as directed. Stockinette Compression Stockings Facilities manager) Signed: 06/19/2022 6:11:05 PM By: Karie Schwalbe RN Entered By: Karie Schwalbe on 06/19/2022 15:15:58 -------------------------------------------------------------------------------- Wound Assessment Details Patient Name: Date of Service: Walter [name], Walter [name] [facility] T. 06/19/2022 2:30 PM Medical Record Number: [id number] Patient Account Number: [id number] Date of Birth/Sex: Treating RN: Mar 05, 1966 (56 y.o. Dianna Limbo Primary Care Shaakira Borrero: Lynnea Ferrier Other Clinician: Referring Anamarie Hunn: Treating Milani Lowenstein/Extender: Suzan Garibaldi in Treatment: 2 Wound Status Wound Number: 2 Primary Etiology: Vasculitis Wound Location: Right, Posterior Lower Leg Wound Status: Open Wounding Event: Gradually Appeared Comorbid History: Gout Date Acquired: 02/10/2022 Weeks Of Treatment: 2 Clustered Wound: Yes Photos Wound Measurements Length: (cm) 24 Width: (cm) 11 Depth: (cm) 0.1 Area: (cm) 207.345 Volume: (cm) 20.735 % Reduction in Area: 69.8% % Reduction in Volume: 84.9% Epithelialization: None Tunneling: No Undermining: No Wound Description Classification: Full Thickness Without Exposed Support Structures Exudate Amount: Large Exudate Type: Serous Exudate Color: amber Foul Odor After Cleansing: No Slough/Fibrino Yes Wound Bed Granulation Amount: Small (1-33%) Exposed Structure Granulation Quality: Red Fascia Exposed: No Necrotic Amount: Large (67-100%) Fat Layer (Subcutaneous Tissue) Exposed: Yes Necrotic Quality: Eschar, Adherent Slough Tendon Exposed: No Muscle Exposed: No Joint Exposed: No Bone Exposed: No Treatment  Notes Wound #2 (Lower Leg) Wound Laterality: Right, Posterior Cleanser Soap and Water Discharge Instruction: May shower and wash wound with dial antibacterial soap and water prior to dressing change. Wound Cleanser Discharge Instruction: Cleanse the wound with wound cleanser prior to applying a clean dressing using gauze sponges, not tissue or cotton balls. Peri-Wound Care Topical Ketoconazole Cream 2% Discharge Instruction: Apply Ketoconazole as directed Triamcinolone Discharge Instruction: Apply Triamcinolone as directed Primary Dressing KerraCel Ag Gelling Fiber Dressing, 4x5 in (silver alginate) Discharge Instruction: Apply silver alginate to wound bed as instructed Secondary Dressing ABD Pad, 8x10 Discharge Instruction: Apply over primary dressing as directed. Zetuvit Plus 4x8 in Discharge Instruction: Apply over primary dressing as directed. Secured With Compression Wrap ThreePress (3 layer compression wrap) Discharge Instruction: Apply three layer compression as directed. Stockinette Compression Stockings Facilities manager) Signed: 06/19/2022 6:11:05 PM By: Karie Schwalbe RN Entered By: Karie Schwalbe on 06/19/2022 15:14:57 --------------------------------------------------------------------------------  Wound Assessment Details Patient Name: Date of Service: Walter [name], BRISBY [facility] T. 06/19/2022 2:30 PM Medical Record Number: [id number] Patient Account Number: [id number] Date of Birth/Sex: Treating RN: 07/08/1966 (56 y.o. Dianna Limbo Primary Care Kathleene Bergemann: Lynnea Ferrier Other Clinician: Referring Chera Slivka: Treating Satina Jerrell/Extender: Suzan Garibaldi in Treatment: 2 Wound Status Wound Number: 5 Primary Etiology: Venous Leg Ulcer Wound Location: Right, Anterior Lower Leg Wound Status: Open Wounding Event: Gradually Appeared Comorbid History: Gout Date Acquired: 06/07/2022 Weeks Of Treatment: 1 Clustered Wound:  No Photos Wound Measurements Length: (cm) 9 Width: (cm) 1 Depth: (cm) 0.1 Area: (cm) 7.069 Volume: (cm) 0.707 % Reduction in Area: 66.7% % Reduction in Volume: 66.7% Epithelialization: None Tunneling: No Undermining: No Wound Description Classification: Full Thickness Without Exposed Support Structures Wound Margin: Distinct, outline attached Exudate Amount: Large Exudate Type: Serosanguineous Exudate Color: red, brown Foul Odor After Cleansing: No Slough/Fibrino Yes Wound Bed Granulation Amount: Small (1-33%) Exposed Structure Granulation Quality: Pink Fascia Exposed: No Necrotic Amount: Large (67-100%) Fat Layer (Subcutaneous Tissue) Exposed: Yes Necrotic Quality: Eschar, Adherent Slough Tendon Exposed: No Muscle Exposed: No Joint Exposed: No Bone Exposed: No Treatment Notes Wound #5 (Lower Leg) Wound Laterality: Right, Anterior Cleanser Soap and Water Discharge Instruction: May shower and wash wound with dial antibacterial soap and water prior to dressing change. Wound Cleanser Discharge Instruction: Cleanse the wound with wound cleanser prior to applying a clean dressing using gauze sponges, not tissue or cotton balls. Peri-Wound Care Topical Ketoconazole Cream 2% Discharge Instruction: Apply Ketoconazole as directed Triamcinolone Discharge Instruction: Apply Triamcinolone as directed Primary Dressing KerraCel Ag Gelling Fiber Dressing, 4x5 in (silver alginate) Discharge Instruction: Apply silver alginate to wound bed as instructed Secondary Dressing ABD Pad, 8x10 Discharge Instruction: Apply over primary dressing as directed. Zetuvit Plus 4x8 in Discharge Instruction: Apply over primary dressing as directed. Secured With Compression Wrap ThreePress (3 layer compression wrap) Discharge Instruction: Apply three layer compression as directed. Stockinette Compression Stockings Facilities manager) Signed: 06/19/2022 6:11:05 PM By: Karie Schwalbe RN Entered By: Karie Schwalbe on 06/19/2022 15:16:30 -------------------------------------------------------------------------------- Wound Assessment Details Patient Name: Date of Service: AVEDIS, BEVIS [facility] T. 06/19/2022 2:30 PM Medical Record Number: [id number] Patient Account Number: [id number] Date of Birth/Sex: Treating RN: 1966-01-14 (56 y.o. Dianna Limbo Primary Care Kshawn Canal: Lynnea Ferrier Other Clinician: Referring Jamani Bearce: Treating Shikita Vaillancourt/Extender: Suzan Garibaldi in Treatment: 2 Wound Status Wound Number: 6 Primary Etiology: Venous Leg Ulcer Wound Location: Left, Anterior Lower Leg Wound Status: Open Wounding Event: Gradually Appeared Comorbid History: Gout Date Acquired: 06/07/2022 Weeks Of Treatment: 1 Clustered Wound: No Photos Wound Measurements Length: (cm) 10 Width: (cm) 3 Depth: (cm) 0.1 Area: (cm) 23.562 Volume: (cm) 2.356 % Reduction in Area: -20% % Reduction in Volume: -20% Epithelialization: None Tunneling: No Undermining: No Wound Description Classification: Full Thickness Without Exposed Support Structures Wound Margin: Distinct, outline attached Exudate Amount: Large Exudate Type: Serosanguineous Exudate Color: red, brown Foul Odor After Cleansing: No Slough/Fibrino Yes Wound Bed Granulation Amount: Small (1-33%) Exposed Structure Granulation Quality: Red Fascia Exposed: No Necrotic Amount: Large (67-100%) Fat Layer (Subcutaneous Tissue) Exposed: Yes Necrotic Quality: Eschar, Adherent Slough Tendon Exposed: No Muscle Exposed: No Joint Exposed: No Bone Exposed: No Treatment Notes Wound #6 (Lower Leg) Wound Laterality: Left, Anterior Cleanser Soap and Water Discharge Instruction: May shower and wash wound with dial antibacterial soap and water prior to dressing change. Wound Cleanser Discharge Instruction: Cleanse the wound with wound cleanser prior to applying a clean dressing using gauze  sponges, not tissue or cotton balls. Peri-Wound Care Topical Ketoconazole Cream 2% Discharge Instruction: Apply Ketoconazole as directed Triamcinolone Discharge Instruction: Apply Triamcinolone as directed Primary Dressing KerraCel Ag Gelling Fiber Dressing, 4x5 in (silver alginate) Discharge Instruction: Apply silver alginate to wound bed as instructed Secondary Dressing ABD Pad, 8x10 Discharge Instruction: Apply over primary dressing as directed. Zetuvit Plus 4x8 in Discharge Instruction: Apply over primary dressing as directed. Secured With Compression Wrap ThreePress (3 layer compression wrap) Discharge Instruction: Apply three layer compression as directed. Stockinette Compression Stockings Facilities manager) Signed: 06/19/2022 6:11:05 PM By: Karie Schwalbe RN Entered By: Karie Schwalbe on 06/19/2022 15:17:10 -------------------------------------------------------------------------------- Vitals Details Patient Name: Date of Service: RAGE, BEEVER [facility] T. 06/19/2022 2:30 PM Medical Record Number: [id number] Patient Account Number: [id number] Date of Birth/Sex: Treating RN: [birth year] (56 y.o. Dianna Limbo Primary Care Zaley Talley: Lynnea Ferrier Other Clinician: Referring Onedia Vargus: Treating Rosangelica Pevehouse/Extender: Suzan Garibaldi in Treatment: 2 Vital Signs Time Taken: 14:36 Temperature (F): 99.6 Height (in): 75 Pulse (bpm): 111 Weight (lbs): 325 Respiratory Rate (breaths/min): 20 Body Mass Index (BMI): 40.6 Blood Pressure (mmHg): 157/80 Reference Range: 80 - 120 mg / dl Electronic Signature(s) Signed: 06/19/2022 6:11:05 PM By: Karie Schwalbe RN Entered By: Karie Schwalbe on 06/19/2022 18:09:14

## 2022-06-26 ENCOUNTER — Encounter (HOSPITAL_BASED_OUTPATIENT_CLINIC_OR_DEPARTMENT_OTHER): Payer: BC Managed Care – PPO | Admitting: General Surgery

## 2022-06-26 DIAGNOSIS — I2699 Other pulmonary embolism without acute cor pulmonale: Secondary | ICD-10-CM | POA: Diagnosis not present

## 2022-06-26 NOTE — Progress Notes (Signed)
Walter [name] ([id number]) , . Visit Report for 06/26/2022 Arrival Information Details Patient Name: Date of Service: Walter [name], Walter [name] [facility] T. 06/26/2022 2:00 PM Medical Record Number: [id number] Patient Account Number: [id number] Date of Birth/Sex: Treating RN: [birth year] (56 y.o. Dianna Limbo Primary Care Isaiyah Feldhaus: Lynnea Ferrier Other Clinician: Referring Josalin Carneiro: Treating Roanne Haye/Extender: Suzan Garibaldi in Treatment: 3 Visit Information History Since Last Visit All ordered tests and consults were completed: Yes Patient Arrived: Wheel Chair Added or deleted any medications: No Arrival Time: 14:17 Any new allergies or adverse reactions: No Accompanied By: wife Had a fall or experienced change in No Transfer Assistance: None activities of daily living that may affect Patient Identification Verified: Yes risk of falls: Secondary Verification Process Completed: Yes Signs or symptoms of abuse/neglect since last visito No Patient Requires Transmission-Based Precautions: No Hospitalized since last visit: No Patient Has Alerts: No Implantable device outside of the clinic excluding No cellular tissue based products placed in the center since last visit: Has Dressing in Place as Prescribed: Yes Has Compression in Place as Prescribed: Yes Pain Present Now: Yes Electronic Signature(s) Signed: 06/26/2022 4:36:53 PM By: Tommie Ard RN Entered By: Tommie Ard on 06/26/2022 14:18:38 -------------------------------------------------------------------------------- Compression Therapy Details Patient Name: Date of Service: Walter [name], Walter [name] [facility] T. 06/26/2022 2:00 PM Medical Record Number: [id number] Patient Account Number: [id number] Date of Birth/Sex: Treating RN: 1966/01/09 (56 y.o. Dianna Limbo Primary Care Waylan Busta: Lynnea Ferrier Other Clinician: Referring Ramiah Helfrich: Treating Tamon Parkerson/Extender: Suzan Garibaldi in Treatment:  3 Compression Therapy Performed for Wound Assessment: Wound #2 Right,Posterior Lower Leg Performed By: Clinician Karie Schwalbe, RN Compression Type: Three Layer Electronic Signature(s) Signed: 06/26/2022 4:36:53 PM By: Tommie Ard RN Entered By: Tommie Ard on 06/26/2022 14:25:57 -------------------------------------------------------------------------------- Compression Therapy Details Patient Name: Date of Service: Walter [name], Walter [name] [facility] T. 06/26/2022 2:00 PM Medical Record Number: [id number] Patient Account Number: [id number] Date of Birth/Sex: Treating RN: [birth year] (56 y.o. Dianna Limbo Primary Care Tierra Thoma: Lynnea Ferrier Other Clinician: Referring Kayonna Lawniczak: Treating Marajade Lei/Extender: Suzan Garibaldi in Treatment: 3 Compression Therapy Performed for Wound Assessment: Wound #1 Left,Posterior Lower Leg Performed By: Clinician Karie Schwalbe, RN Compression Type: Three Layer Electronic Signature(s) Signed: 06/26/2022 5:26:07 PM By: Karie Schwalbe RN Previous Signature: 06/26/2022 4:36:53 PM Version By: Tommie Ard RN Entered By: Karie Schwalbe on 06/26/2022 17:06:29 -------------------------------------------------------------------------------- Encounter Discharge Information Details Patient Name: Date of Service: Walter [name], Walter [name] [facility] T. 06/26/2022 2:00 PM Medical Record Number: [id number] Patient Account Number: [id number] Date of Birth/Sex: Treating RN: Feb 06, 1966 (56 y.o. Dianna Limbo Primary Care Annemarie Sebree: Lynnea Ferrier Other Clinician: Referring Kristy Schomburg: Treating Maziyah Vessel/Extender: Suzan Garibaldi in Treatment: 3 Encounter Discharge Information Items Discharge Condition: Stable Ambulatory Status: Wheelchair Discharge Destination: Home Transportation: Private Auto Accompanied By: spouse Schedule Follow-up Appointment: Yes Clinical Summary of Care: Patient Declined Electronic Signature(s) Signed: 06/26/2022  5:26:07 PM By: Karie Schwalbe RN Entered By: Karie Schwalbe on 06/26/2022 17:06:01 -------------------------------------------------------------------------------- Patient/Caregiver Education Details Patient Name: Date of Service: Walter [name], Walter [name] [facility] T. 8/8/2023andnbsp2:00 PM Medical Record Number: [id number] Patient Account Number: [id number] Date of Birth/Gender: Treating RN: [birth year] (56 y.o. Dianna Limbo Primary Care Physician: Lynnea Ferrier Other Clinician: Referring Physician: Treating Physician/Extender: Suzan Garibaldi in Treatment: 3 Education Assessment Education Provided To: Patient Education Topics Provided Wound/Skin Impairment: Methods: Explain/Verbal Responses: Return demonstration correctly Electronic Signature(s) Signed: 06/26/2022 5:26:07 PM By: Karie Schwalbe RN Entered By: Karie Schwalbe on 06/26/2022 17:05:39 -------------------------------------------------------------------------------- Wound Assessment Details Patient Name: Date of Service:  Walter [name], Walter [name] T. 06/26/2022 2:00 PM Medical Record Number: [id number] Patient Account Number: [id number] Date of Birth/Sex: Treating RN: 04/12/1966 (56 y.o. Dianna Limbo Primary Care Chamaine Stankus: Lynnea Ferrier Other Clinician: Referring Muneeb Veras: Treating Marqueze Ramcharan/Extender: Suzan Garibaldi in Treatment: 3 Wound Status Wound Number: 1 Primary Etiology: Vasculitis Wound Location: Left, Posterior Lower Leg Wound Status: Open Wounding Event: Gradually Appeared Comorbid History: Gout Date Acquired: 02/10/2022 Weeks Of Treatment: 3 Clustered Wound: Yes Wound Measurements Length: (cm) 18 Width: (cm) 12 Depth: (cm) 0.1 Area: (cm) 169.646 Volume: (cm) 16.965 % Reduction in Area: 75.3% % Reduction in Volume: 87.7% Epithelialization: None Tunneling: No Undermining: No Wound Description Classification: Full Thickness Without Exposed Support  Structures Exudate Amount: Large Exudate Type: Serous Exudate Color: amber Foul Odor After Cleansing: No Slough/Fibrino Yes Wound Bed Granulation Amount: Small (1-33%) Exposed Structure Granulation Quality: Pink Fascia Exposed: No Necrotic Amount: Large (67-100%) Fat Layer (Subcutaneous Tissue) Exposed: Yes Necrotic Quality: Eschar, Adherent Slough Tendon Exposed: No Muscle Exposed: No Joint Exposed: No Bone Exposed: No Treatment Notes Wound #1 (Lower Leg) Wound Laterality: Left, Posterior Cleanser Soap and Water Discharge Instruction: May shower and wash wound with dial antibacterial soap and water prior to dressing change. Wound Cleanser Discharge Instruction: Cleanse the wound with wound cleanser prior to applying a clean dressing using gauze sponges, not tissue or cotton balls. Peri-Wound Care Topical Ketoconazole Cream 2% Discharge Instruction: Apply Ketoconazole as directed Triamcinolone Discharge Instruction: Apply Triamcinolone as directed Primary Dressing KerraCel Ag Gelling Fiber Dressing, 4x5 in (silver alginate) Discharge Instruction: Apply silver alginate to wound bed as instructed Secondary Dressing ABD Pad, 8x10 Discharge Instruction: Apply over primary dressing as directed. Zetuvit Plus 4x8 in Discharge Instruction: Apply over primary dressing as directed. Secured With Compression Wrap ThreePress (3 layer compression wrap) Discharge Instruction: Apply three layer compression as directed. Stockinette Compression Stockings Facilities manager) Signed: 06/26/2022 4:36:53 PM By: Tommie Ard RN Signed: 06/26/2022 5:26:07 PM By: Karie Schwalbe RN Entered By: Tommie Ard on 06/26/2022 14:23:41 -------------------------------------------------------------------------------- Wound Assessment Details Patient Name: Date of Service: Walter [name], Walter [name] [facility] T. 06/26/2022 2:00 PM Medical Record Number: [id number] Patient Account Number: [id number] Date of  Birth/Sex: Treating RN: 1966/09/14 (56 y.o. Dianna Limbo Primary Care Sae Handrich: Lynnea Ferrier Other Clinician: Referring Haven Pylant: Treating Tippi Mccrae/Extender: Suzan Garibaldi in Treatment: 3 Wound Status Wound Number: 2 Primary Etiology: Vasculitis Wound Location: Right, Posterior Lower Leg Wound Status: Open Wounding Event: Gradually Appeared Comorbid History: Gout Date Acquired: 02/10/2022 Weeks Of Treatment: 3 Clustered Wound: Yes Wound Measurements Length: (cm) 24 Width: (cm) 11 Depth: (cm) 0.1 Area: (cm) 207.345 Volume: (cm) 20.735 % Reduction in Area: 69.8% % Reduction in Volume: 84.9% Epithelialization: None Tunneling: No Undermining: No Wound Description Classification: Full Thickness Without Exposed Support Structures Exudate Amount: Large Exudate Type: Serous Exudate Color: amber Foul Odor After Cleansing: No Slough/Fibrino Yes Wound Bed Granulation Amount: Small (1-33%) Exposed Structure Granulation Quality: Red Fascia Exposed: No Necrotic Amount: Large (67-100%) Fat Layer (Subcutaneous Tissue) Exposed: Yes Necrotic Quality: Eschar, Adherent Slough Tendon Exposed: No Muscle Exposed: No Joint Exposed: No Bone Exposed: No Treatment Notes Wound #2 (Lower Leg) Wound Laterality: Right, Posterior Cleanser Soap and Water Discharge Instruction: May shower and wash wound with dial antibacterial soap and water prior to dressing change. Wound Cleanser Discharge Instruction: Cleanse the wound with wound cleanser prior to applying a clean dressing using gauze sponges, not tissue or cotton balls. Peri-Wound Care Topical Ketoconazole Cream 2% Discharge Instruction: Apply Ketoconazole as directed  Triamcinolone Discharge Instruction: Apply Triamcinolone as directed Primary Dressing KerraCel Ag Gelling Fiber Dressing, 4x5 in (silver alginate) Discharge Instruction: Apply silver alginate to wound bed as instructed Secondary  Dressing ABD Pad, 8x10 Discharge Instruction: Apply over primary dressing as directed. Zetuvit Plus 4x8 in Discharge Instruction: Apply over primary dressing as directed. Secured With Compression Wrap ThreePress (3 layer compression wrap) Discharge Instruction: Apply three layer compression as directed. Stockinette Compression Stockings Facilities manager) Signed: 06/26/2022 4:36:53 PM By: Tommie Ard RN Signed: 06/26/2022 5:26:07 PM By: Karie Schwalbe RN Entered By: Tommie Ard on 06/26/2022 14:23:49 -------------------------------------------------------------------------------- Wound Assessment Details Patient Name: Date of Service: Walter [name], Walter [name] [facility] T. 06/26/2022 2:00 PM Medical Record Number: [id number] Patient Account Number: [id number] Date of Birth/Sex: Treating RN: Jan 27, 1966 (56 y.o. Dianna Limbo Primary Care Jasmen Emrich: Lynnea Ferrier Other Clinician: Referring Rickie Gange: Treating Sehaj Kolden/Extender: Suzan Garibaldi in Treatment: 3 Wound Status Wound Number: 5 Primary Etiology: Venous Leg Ulcer Wound Location: Right, Anterior Lower Leg Wound Status: Open Wounding Event: Gradually Appeared Comorbid History: Gout Date Acquired: 06/07/2022 Weeks Of Treatment: 2 Clustered Wound: No Wound Measurements Length: (cm) 9 Width: (cm) 1 Depth: (cm) 0.1 Area: (cm) 7.069 Volume: (cm) 0.707 Wound Description Classification: Full Thickness Without Exposed Support Structures Wound Margin: Distinct, outline attached Exudate Amount: Large Exudate Type: Serosanguineous Exudate Color: red, brown Foul Odor After Cleansing: N Slough/Fibrino Y % Reduction in Area: 66.7% % Reduction in Volume: 66.7% Epithelialization: None Tunneling: No Undermining: No o es Wound Bed Granulation Amount: Small (1-33%) Exposed Structure Granulation Quality: Pink Fascia Exposed: No Necrotic Amount: Large (67-100%) Fat Layer (Subcutaneous Tissue)  Exposed: Yes Necrotic Quality: Eschar, Adherent Slough Tendon Exposed: No Muscle Exposed: No Joint Exposed: No Bone Exposed: No Treatment Notes Wound #5 (Lower Leg) Wound Laterality: Right, Anterior Cleanser Soap and Water Discharge Instruction: May shower and wash wound with dial antibacterial soap and water prior to dressing change. Wound Cleanser Discharge Instruction: Cleanse the wound with wound cleanser prior to applying a clean dressing using gauze sponges, not tissue or cotton balls. Peri-Wound Care Topical Ketoconazole Cream 2% Discharge Instruction: Apply Ketoconazole as directed Triamcinolone Discharge Instruction: Apply Triamcinolone as directed Primary Dressing KerraCel Ag Gelling Fiber Dressing, 4x5 in (silver alginate) Discharge Instruction: Apply silver alginate to wound bed as instructed Secondary Dressing ABD Pad, 8x10 Discharge Instruction: Apply over primary dressing as directed. Zetuvit Plus 4x8 in Discharge Instruction: Apply over primary dressing as directed. Secured With Compression Wrap ThreePress (3 layer compression wrap) Discharge Instruction: Apply three layer compression as directed. Stockinette Compression Stockings Facilities manager) Signed: 06/26/2022 4:36:53 PM By: Tommie Ard RN Signed: 06/26/2022 5:26:07 PM By: Karie Schwalbe RN Entered By: Tommie Ard on 06/26/2022 14:23:58 -------------------------------------------------------------------------------- Wound Assessment Details Patient Name: Date of Service: Walter [name], Walter [name] [facility] T. 06/26/2022 2:00 PM Medical Record Number: [id number] Patient Account Number: [id number] Date of Birth/Sex: Treating RN: May 21, 1966 (56 y.o. Dianna Limbo Primary Care Gabor Lusk: Lynnea Ferrier Other Clinician: Referring Yoshiaki Kreuser: Treating Joi Leyva/Extender: Suzan Garibaldi in Treatment: 3 Wound Status Wound Number: 6 Primary Etiology: Venous Leg Ulcer Wound  Location: Left, Anterior Lower Leg Wound Status: Open Wounding Event: Gradually Appeared Comorbid History: Gout Date Acquired: 06/07/2022 Weeks Of Treatment: 2 Clustered Wound: No Wound Measurements Length: (cm) 10 Width: (cm) 3 Depth: (cm) 0.1 Area: (cm) 23.562 Volume: (cm) 2.356 % Reduction in Area: -20% % Reduction in Volume: -20% Epithelialization: None Tunneling: No Undermining: No Wound Description Classification: Full Thickness Without Exposed Support Structures Wound Margin: Distinct,  outline attached Exudate Amount: Large Exudate Type: Serosanguineous Exudate Color: red, brown Foul Odor After Cleansing: No Slough/Fibrino Yes Wound Bed Granulation Amount: Small (1-33%) Exposed Structure Granulation Quality: Red Fascia Exposed: No Necrotic Amount: Large (67-100%) Fat Layer (Subcutaneous Tissue) Exposed: Yes Necrotic Quality: Eschar, Adherent Slough Tendon Exposed: No Muscle Exposed: No Joint Exposed: No Bone Exposed: No Treatment Notes Wound #6 (Lower Leg) Wound Laterality: Left, Anterior Cleanser Soap and Water Discharge Instruction: May shower and wash wound with dial antibacterial soap and water prior to dressing change. Wound Cleanser Discharge Instruction: Cleanse the wound with wound cleanser prior to applying a clean dressing using gauze sponges, not tissue or cotton balls. Peri-Wound Care Topical Ketoconazole Cream 2% Discharge Instruction: Apply Ketoconazole as directed Triamcinolone Discharge Instruction: Apply Triamcinolone as directed Primary Dressing KerraCel Ag Gelling Fiber Dressing, 4x5 in (silver alginate) Discharge Instruction: Apply silver alginate to wound bed as instructed Secondary Dressing ABD Pad, 8x10 Discharge Instruction: Apply over primary dressing as directed. Zetuvit Plus 4x8 in Discharge Instruction: Apply over primary dressing as directed. Secured With Compression Wrap ThreePress (3 layer compression wrap) Discharge  Instruction: Apply three layer compression as directed. Stockinette Compression Stockings Facilities manager) Signed: 06/26/2022 4:36:53 PM By: Tommie Ard RN Signed: 06/26/2022 5:26:07 PM By: Karie Schwalbe RN Entered By: Tommie Ard on 06/26/2022 14:24:06 -------------------------------------------------------------------------------- Vitals Details Patient Name: Date of Service: Walter [name], Walter [name] [facility] T. 06/26/2022 2:00 PM Medical Record Number: [id number] Patient Account Number: [id number] Date of Birth/Sex: Treating RN: Jun 21, 1966 (56 y.o. Dianna Limbo Primary Care Evett Kassa: Lynnea Ferrier Other Clinician: Referring Summerlynn Glauser: Treating Xavier Munger/Extender: Suzan Garibaldi in Treatment: 3 Vital Signs Time Taken: 14:18 Temperature (F): 98.3 Height (in): 75 Pulse (bpm): 110 Weight (lbs): 325 Respiratory Rate (breaths/min): 20 Body Mass Index (BMI): 40.6 Blood Pressure (mmHg): 95/70 Reference Range: 80 - 120 mg / dl Electronic Signature(s) Signed: 06/26/2022 4:36:53 PM By: Tommie Ard RN Entered By: Tommie Ard on 06/26/2022 14:22:24

## 2022-06-27 NOTE — Progress Notes (Signed)
Walter [name] ([id number]) , . Visit Report for 06/26/2022 SuperBill Details Patient Name: Date of Service: Walter, [name] [facility] T. 06/26/2022 Medical Record Number: [id number] Patient Account Number: [id number] Date of Birth/Sex: Treating RN: [birth year] (56 y.o. 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 Electronic Signature(s) Signed: 06/26/2022 5:26:07 PM By: Karie Schwalbe RN Signed: 06/27/2022 7:33:15 AM By: Duanne Guess MD FACS Entered By: Karie Schwalbe on 06/26/2022 17:07:07

## 2022-06-28 ENCOUNTER — Inpatient Hospital Stay (HOSPITAL_COMMUNITY): Payer: BC Managed Care – PPO

## 2022-06-28 ENCOUNTER — Emergency Department (HOSPITAL_BASED_OUTPATIENT_CLINIC_OR_DEPARTMENT_OTHER): Payer: BC Managed Care – PPO | Admitting: Radiology

## 2022-06-28 ENCOUNTER — Other Ambulatory Visit: Payer: Self-pay

## 2022-06-28 ENCOUNTER — Inpatient Hospital Stay (HOSPITAL_BASED_OUTPATIENT_CLINIC_OR_DEPARTMENT_OTHER)
Admission: EM | Admit: 2022-06-28 | Discharge: 2022-07-20 | DRG: 208 | Disposition: E | Payer: BC Managed Care – PPO | Attending: Pulmonary Disease | Admitting: Pulmonary Disease

## 2022-06-28 ENCOUNTER — Encounter (HOSPITAL_COMMUNITY): Payer: Self-pay

## 2022-06-28 ENCOUNTER — Encounter (HOSPITAL_BASED_OUTPATIENT_CLINIC_OR_DEPARTMENT_OTHER): Payer: Self-pay

## 2022-06-28 ENCOUNTER — Ambulatory Visit: Payer: BC Managed Care – PPO | Admitting: Family Medicine

## 2022-06-28 VITALS — BP 114/90 | HR 117 | Temp 97.9°F | Ht 75.0 in | Wt 374.0 lb

## 2022-06-28 DIAGNOSIS — D696 Thrombocytopenia, unspecified: Secondary | ICD-10-CM | POA: Diagnosis present

## 2022-06-28 DIAGNOSIS — E878 Other disorders of electrolyte and fluid balance, not elsewhere classified: Secondary | ICD-10-CM | POA: Diagnosis present

## 2022-06-28 DIAGNOSIS — R000 Tachycardia, unspecified: Secondary | ICD-10-CM

## 2022-06-28 DIAGNOSIS — J9601 Acute respiratory failure with hypoxia: Secondary | ICD-10-CM

## 2022-06-28 DIAGNOSIS — M310 Hypersensitivity angiitis: Secondary | ICD-10-CM | POA: Diagnosis present

## 2022-06-28 DIAGNOSIS — R21 Rash and other nonspecific skin eruption: Secondary | ICD-10-CM | POA: Diagnosis present

## 2022-06-28 DIAGNOSIS — A419 Sepsis, unspecified organism: Secondary | ICD-10-CM

## 2022-06-28 DIAGNOSIS — I2699 Other pulmonary embolism without acute cor pulmonale: Secondary | ICD-10-CM | POA: Diagnosis not present

## 2022-06-28 DIAGNOSIS — Z66 Do not resuscitate: Secondary | ICD-10-CM | POA: Diagnosis not present

## 2022-06-28 DIAGNOSIS — R410 Disorientation, unspecified: Secondary | ICD-10-CM | POA: Diagnosis not present

## 2022-06-28 DIAGNOSIS — I288 Other diseases of pulmonary vessels: Secondary | ICD-10-CM | POA: Diagnosis present

## 2022-06-28 DIAGNOSIS — M069 Rheumatoid arthritis, unspecified: Secondary | ICD-10-CM | POA: Diagnosis present

## 2022-06-28 DIAGNOSIS — I468 Cardiac arrest due to other underlying condition: Secondary | ICD-10-CM | POA: Diagnosis not present

## 2022-06-28 DIAGNOSIS — R7303 Prediabetes: Secondary | ICD-10-CM | POA: Diagnosis present

## 2022-06-28 DIAGNOSIS — R570 Cardiogenic shock: Secondary | ICD-10-CM | POA: Diagnosis present

## 2022-06-28 DIAGNOSIS — E785 Hyperlipidemia, unspecified: Secondary | ICD-10-CM | POA: Diagnosis present

## 2022-06-28 DIAGNOSIS — N179 Acute kidney failure, unspecified: Secondary | ICD-10-CM | POA: Diagnosis present

## 2022-06-28 DIAGNOSIS — Z515 Encounter for palliative care: Secondary | ICD-10-CM | POA: Diagnosis not present

## 2022-06-28 DIAGNOSIS — E872 Acidosis, unspecified: Secondary | ICD-10-CM | POA: Diagnosis present

## 2022-06-28 DIAGNOSIS — E871 Hypo-osmolality and hyponatremia: Secondary | ICD-10-CM | POA: Diagnosis present

## 2022-06-28 DIAGNOSIS — R0902 Hypoxemia: Secondary | ICD-10-CM | POA: Diagnosis not present

## 2022-06-28 DIAGNOSIS — E875 Hyperkalemia: Secondary | ICD-10-CM | POA: Diagnosis present

## 2022-06-28 DIAGNOSIS — R778 Other specified abnormalities of plasma proteins: Secondary | ICD-10-CM | POA: Diagnosis present

## 2022-06-28 DIAGNOSIS — I4510 Unspecified right bundle-branch block: Secondary | ICD-10-CM | POA: Diagnosis present

## 2022-06-28 DIAGNOSIS — I10 Essential (primary) hypertension: Secondary | ICD-10-CM | POA: Diagnosis present

## 2022-06-28 DIAGNOSIS — D72829 Elevated white blood cell count, unspecified: Secondary | ICD-10-CM | POA: Diagnosis present

## 2022-06-28 DIAGNOSIS — M109 Gout, unspecified: Secondary | ICD-10-CM | POA: Diagnosis present

## 2022-06-28 DIAGNOSIS — J9690 Respiratory failure, unspecified, unspecified whether with hypoxia or hypercapnia: Secondary | ICD-10-CM | POA: Diagnosis present

## 2022-06-28 DIAGNOSIS — K7200 Acute and subacute hepatic failure without coma: Secondary | ICD-10-CM | POA: Diagnosis present

## 2022-06-28 DIAGNOSIS — Z20822 Contact with and (suspected) exposure to covid-19: Secondary | ICD-10-CM | POA: Diagnosis present

## 2022-06-28 DIAGNOSIS — Z993 Dependence on wheelchair: Secondary | ICD-10-CM

## 2022-06-28 DIAGNOSIS — I509 Heart failure, unspecified: Secondary | ICD-10-CM

## 2022-06-28 DIAGNOSIS — Z6841 Body Mass Index (BMI) 40.0 and over, adult: Secondary | ICD-10-CM

## 2022-06-28 DIAGNOSIS — Z7952 Long term (current) use of systemic steroids: Secondary | ICD-10-CM

## 2022-06-28 DIAGNOSIS — Z79899 Other long term (current) drug therapy: Secondary | ICD-10-CM

## 2022-06-28 LAB — RESPIRATORY PANEL BY PCR

## 2022-06-28 LAB — CBC
HCT: 45.7 % (ref 39.0–52.0)
HCT: 47.6 % (ref 39.0–52.0)
Hemoglobin: 13.8 g/dL (ref 13.0–17.0)
Hemoglobin: 15 g/dL (ref 13.0–17.0)
MCH: 30.3 pg (ref 26.0–34.0)
MCH: 30.2 pg (ref 26.0–34.0)
MCHC: 30.2 g/dL (ref 30.0–36.0)
MCHC: 31.5 g/dL (ref 30.0–36.0)
MCV: 100.2 fL — ABNORMAL HIGH (ref 80.0–100.0)
MCV: 96 fL (ref 80.0–100.0)
Platelets: 83 10*3/uL — ABNORMAL LOW (ref 150–400)
Platelets: 98 10*3/uL — ABNORMAL LOW (ref 150–400)
RBC: 4.56 MIL/uL (ref 4.22–5.81)
RBC: 4.96 MIL/uL (ref 4.22–5.81)
RDW: 15.4 % (ref 11.5–15.5)
RDW: 15.6 % — ABNORMAL HIGH (ref 11.5–15.5)
WBC: 27 10*3/uL — ABNORMAL HIGH (ref 4.0–10.5)
WBC: 29 10*3/uL — ABNORMAL HIGH (ref 4.0–10.5)
nRBC: 5 % — ABNORMAL HIGH (ref 0.0–0.2)
nRBC: 1.5 % — ABNORMAL HIGH (ref 0.0–0.2)

## 2022-06-28 LAB — DIFFERENTIAL
Abs Immature Granulocytes: 3.34 10*3/uL — ABNORMAL HIGH (ref 0.00–0.07)
Basophils Absolute: 0.1 10*3/uL (ref 0.0–0.1)
Basophils Relative: 0 %
Eosinophils Absolute: 0 10*3/uL (ref 0.0–0.5)
Eosinophils Relative: 0 %
Immature Granulocytes: 12 %
Lymphocytes Relative: 7 %
Lymphs Abs: 2 10*3/uL (ref 0.7–4.0)
Monocytes Absolute: 1.7 10*3/uL — ABNORMAL HIGH (ref 0.1–1.0)
Monocytes Relative: 6 %
Neutro Abs: 19.9 10*3/uL — ABNORMAL HIGH (ref 1.7–7.7)
Neutrophils Relative %: 75 %

## 2022-06-28 LAB — POCT I-STAT 7, (LYTES, BLD GAS, ICA,H+H)
Acid-base deficit: 5 mmol/L — ABNORMAL HIGH (ref 0.0–2.0)
Acid-base deficit: 12 mmol/L — ABNORMAL HIGH (ref 0.0–2.0)
Bicarbonate: 26 mmol/L (ref 20.0–28.0)
Bicarbonate: 21.9 mmol/L (ref 20.0–28.0)
Calcium, Ion: 1.54 mmol/L (ref 1.15–1.40)
Calcium, Ion: 1.13 mmol/L — ABNORMAL LOW (ref 1.15–1.40)
HCT: 46 % (ref 39.0–52.0)
HCT: 45 % (ref 39.0–52.0)
Hemoglobin: 15.6 g/dL (ref 13.0–17.0)
Hemoglobin: 15.3 g/dL (ref 13.0–17.0)
O2 Saturation: 98 %
O2 Saturation: 90 %
Patient temperature: 97
Patient temperature: 97
Potassium: 5.7 mmol/L — ABNORMAL HIGH (ref 3.5–5.1)
Potassium: 6 mmol/L — ABNORMAL HIGH (ref 3.5–5.1)
Sodium: 136 mmol/L (ref 135–145)
Sodium: 136 mmol/L (ref 135–145)
TCO2: 28 mmol/L (ref 22–32)
TCO2: 25 mmol/L (ref 22–32)
pCO2 arterial: 69.4 mmHg (ref 32–48)
pCO2 arterial: 93.1 mmHg (ref 32–48)
pH, Arterial: 7.176 — CL (ref 7.35–7.45)
pH, Arterial: 6.973 — CL (ref 7.35–7.45)
pO2, Arterial: 129 mmHg — ABNORMAL HIGH (ref 83–108)
pO2, Arterial: 90 mmHg (ref 83–108)

## 2022-06-28 LAB — HEPATIC FUNCTION PANEL
ALT: 1450 U/L — ABNORMAL HIGH (ref 0–44)
AST: 598 U/L — ABNORMAL HIGH (ref 15–41)
Albumin: 4.1 g/dL (ref 3.5–5.0)
Alkaline Phosphatase: 58 U/L (ref 38–126)
Bilirubin, Direct: 0.4 mg/dL — ABNORMAL HIGH (ref 0.0–0.2)
Indirect Bilirubin: 0.4 mg/dL (ref 0.3–0.9)
Total Bilirubin: 0.8 mg/dL (ref 0.3–1.2)
Total Protein: 7.3 g/dL (ref 6.5–8.1)

## 2022-06-28 LAB — BASIC METABOLIC PANEL
Anion gap: 19 — ABNORMAL HIGH (ref 5–15)
Anion gap: 18 — ABNORMAL HIGH (ref 5–15)
BUN: 67 mg/dL — ABNORMAL HIGH (ref 6–20)
BUN: 67 mg/dL — ABNORMAL HIGH (ref 6–20)
CO2: 21 mmol/L — ABNORMAL LOW (ref 22–32)
CO2: 18 mmol/L — ABNORMAL LOW (ref 22–32)
Calcium: 9.1 mg/dL (ref 8.9–10.3)
Calcium: 9.6 mg/dL (ref 8.9–10.3)
Chloride: 101 mmol/L (ref 98–111)
Chloride: 96 mmol/L — ABNORMAL LOW (ref 98–111)
Creatinine, Ser: 3.33 mg/dL — ABNORMAL HIGH (ref 0.61–1.24)
Creatinine, Ser: 2.54 mg/dL — ABNORMAL HIGH (ref 0.61–1.24)
GFR, Estimated: 21 mL/min — ABNORMAL LOW (ref >60)
GFR, Estimated: 29 mL/min — ABNORMAL LOW (ref >60)
Glucose, Bld: 319 mg/dL — ABNORMAL HIGH (ref 70–99)
Glucose, Bld: 294 mg/dL — ABNORMAL HIGH (ref 70–99)
Potassium: 5.8 mmol/L — ABNORMAL HIGH (ref 3.5–5.1)
Potassium: 5.8 mmol/L — ABNORMAL HIGH (ref 3.5–5.1)
Sodium: 141 mmol/L (ref 135–145)
Sodium: 132 mmol/L — ABNORMAL LOW (ref 135–145)

## 2022-06-28 LAB — D-DIMER, QUANTITATIVE: D-Dimer, Quant: >20 ug/mL-FEU — ABNORMAL HIGH (ref 0.00–0.50)

## 2022-06-28 LAB — HIV ANTIBODY (ROUTINE TESTING W REFLEX): HIV Screen 4th Generation wRfx: NONREACTIVE

## 2022-06-28 LAB — GLUCOSE, CAPILLARY: Glucose-Capillary: 251 mg/dL — ABNORMAL HIGH (ref 70–99)

## 2022-06-28 LAB — TROPONIN I (HIGH SENSITIVITY)
Troponin I (High Sensitivity): 262 ng/L (ref <18)
Troponin I (High Sensitivity): 317 ng/L (ref <18)

## 2022-06-28 LAB — PROCALCITONIN: Procalcitonin: 1.23 ng/mL

## 2022-06-28 LAB — SEDIMENTATION RATE: Sed Rate: 1 mm/hr (ref 0–16)

## 2022-06-28 LAB — PROTIME-INR
INR: 1.5 — ABNORMAL HIGH (ref 0.8–1.2)
Prothrombin Time: 18.1 seconds — ABNORMAL HIGH (ref 11.4–15.2)

## 2022-06-28 LAB — LACTIC ACID, PLASMA
Lactic Acid, Venous: 5.6 mmol/L (ref 0.5–1.9)
Lactic Acid, Venous: 6.3 mmol/L (ref 0.5–1.9)

## 2022-06-28 LAB — SARS CORONAVIRUS 2 BY RT PCR: SARS Coronavirus 2 by RT PCR: NEGATIVE

## 2022-06-28 LAB — HEPARIN LEVEL (UNFRACTIONATED): Heparin Unfractionated: 0.18 IU/mL — ABNORMAL LOW (ref 0.30–0.70)

## 2022-06-28 LAB — C-REACTIVE PROTEIN: CRP: 6.4 mg/dL — ABNORMAL HIGH (ref <1.0)

## 2022-06-28 LAB — BRAIN NATRIURETIC PEPTIDE: B Natriuretic Peptide: 1043.7 pg/mL — ABNORMAL HIGH (ref 0.0–100.0)

## 2022-06-28 LAB — CK: Total CK: 31 U/L — ABNORMAL LOW (ref 49–397)

## 2022-06-28 MED ORDER — CALCIUM CHLORIDE 10 % IV SOLN
INTRAVENOUS | Status: AC
  Administered 2022-06-28: 1000 mg
  Filled 2022-06-28: qty 10

## 2022-06-28 MED ORDER — EPINEPHRINE HCL 5 MG/250ML IV SOLN IN NS: 0.5000 ug/min | INTRAVENOUS | Status: DC

## 2022-06-28 MED ORDER — SODIUM CHLORIDE 0.9 % IV SOLN: 1000.0000 mg | Freq: Every day | INTRAVENOUS | Status: DC

## 2022-06-28 MED ORDER — AMIODARONE HCL IN DEXTROSE 360-4.14 MG/200ML-% IV SOLN: 30.0000 mg/h | INTRAVENOUS | Status: DC

## 2022-06-28 MED ORDER — SODIUM BICARBONATE 8.4 % IV SOLN
50.0000 meq | Freq: Once | INTRAVENOUS | Status: DC
Start: 2022-06-28 — End: 2022-06-29

## 2022-06-28 MED ORDER — SODIUM CHLORIDE 0.9 % IV BOLUS
500.0000 mL | Freq: Once | INTRAVENOUS | Status: AC
  Administered 2022-06-28: 500 mL via INTRAVENOUS

## 2022-06-28 MED ORDER — ROCURONIUM BROMIDE 10 MG/ML (PF) SYRINGE
PREFILLED_SYRINGE | INTRAVENOUS | Status: AC
  Administered 2022-06-28: 100 mg
  Filled 2022-06-28: qty 10

## 2022-06-28 MED ORDER — PRIMAQUINE PHOSPHATE 26.3 (15 BASE) MG PO TABS
30.0000 mg | ORAL_TABLET | Freq: Every day | ORAL | Status: DC
  Filled 2022-06-28: qty 2

## 2022-06-28 MED ORDER — HYDROMORPHONE HCL 1 MG/ML IJ SOLN: 1.0000 mg | INTRAMUSCULAR | Status: DC | PRN

## 2022-06-28 MED ORDER — SODIUM CHLORIDE 0.9 % IV SOLN: Freq: Once | INTRAVENOUS | Status: AC

## 2022-06-28 MED ORDER — FENTANYL CITRATE PF 50 MCG/ML IJ SOSY
50.0000 ug | PREFILLED_SYRINGE | Freq: Once | INTRAMUSCULAR | Status: AC
  Administered 2022-06-28: 50 ug via INTRAVENOUS
  Filled 2022-06-28: qty 1

## 2022-06-28 MED ORDER — METHYLENE BLUE 1 % INJ SOLN
1.0000 mg/kg | Freq: Once | INTRAVENOUS | Status: AC
  Administered 2022-06-28: 170 mg via INTRAVENOUS
  Filled 2022-06-28: qty 17

## 2022-06-28 MED ORDER — HYDROMORPHONE HCL 1 MG/ML IJ SOLN
INTRAMUSCULAR | Status: AC
  Filled 2022-06-28: qty 1

## 2022-06-28 MED ORDER — POLYETHYLENE GLYCOL 3350 17 G PO PACK: 17.0000 g | PACK | Freq: Every day | ORAL | Status: DC | PRN

## 2022-06-28 MED ORDER — EPINEPHRINE HCL 5 MG/250ML IV SOLN IN NS
INTRAVENOUS | Status: AC
  Filled 2022-06-28: qty 250

## 2022-06-28 MED ORDER — ONDANSETRON HCL 4 MG/2ML IJ SOLN: 4.0000 mg | Freq: Four times a day (QID) | INTRAMUSCULAR | Status: DC | PRN

## 2022-06-28 MED ORDER — METHYLPREDNISOLONE SODIUM SUCC 1000 MG IJ SOLR
1000.0000 mg | Freq: Once | INTRAMUSCULAR | Status: DC
  Filled 2022-06-28: qty 16

## 2022-06-28 MED ORDER — NOREPINEPHRINE 4 MG/250ML-% IV SOLN
INTRAVENOUS | Status: AC
  Filled 2022-06-28: qty 250

## 2022-06-28 MED ORDER — EPINEPHRINE 1 MG/10ML IJ SOSY
PREFILLED_SYRINGE | INTRAMUSCULAR | Status: AC
  Filled 2022-06-28: qty 10

## 2022-06-28 MED ORDER — HEPARIN BOLUS VIA INFUSION
4000.0000 [IU] | Freq: Once | INTRAVENOUS | Status: AC
  Administered 2022-06-28: 4000 [IU] via INTRAVENOUS

## 2022-06-28 MED ORDER — VASOPRESSIN 20 UNITS/100 ML INFUSION FOR SHOCK
0.0000 [IU]/min | INTRAVENOUS | Status: DC
  Administered 2022-06-28: 0.03 [IU]/min via INTRAVENOUS

## 2022-06-28 MED ORDER — CLINDAMYCIN PHOSPHATE 900 MG/50ML IV SOLN
900.0000 mg | Freq: Three times a day (TID) | INTRAVENOUS | Status: DC
  Filled 2022-06-28 (×2): qty 50

## 2022-06-28 MED ORDER — AMIODARONE LOAD VIA INFUSION
150.0000 mg | Freq: Once | INTRAVENOUS | Status: DC
  Filled 2022-06-28: qty 83.34

## 2022-06-28 MED ORDER — MIDAZOLAM HCL 2 MG/2ML IJ SOLN
INTRAMUSCULAR | Status: AC
  Filled 2022-06-28: qty 2

## 2022-06-28 MED ORDER — SODIUM CHLORIDE 0.9 % IV SOLN
2.0000 g | Freq: Two times a day (BID) | INTRAVENOUS | Status: DC
  Administered 2022-06-28: 2 g via INTRAVENOUS
  Filled 2022-06-28: qty 12.5

## 2022-06-28 MED ORDER — ETOMIDATE 2 MG/ML IV SOLN
INTRAVENOUS | Status: AC
  Administered 2022-06-28: 20 mg
  Filled 2022-06-28: qty 10

## 2022-06-28 MED ORDER — SODIUM CHLORIDE 0.9 % IV SOLN
250.0000 mg | Freq: Two times a day (BID) | INTRAVENOUS | Status: DC
  Administered 2022-06-28: 250 mg via INTRAVENOUS
  Filled 2022-06-28 (×2): qty 4

## 2022-06-28 MED ORDER — SODIUM BICARBONATE 8.4 % IV SOLN
INTRAVENOUS | Status: AC
  Filled 2022-06-28: qty 50

## 2022-06-28 MED ORDER — MIDAZOLAM HCL 2 MG/2ML IJ SOLN
INTRAMUSCULAR | Status: AC
  Administered 2022-06-28: 1 mg
  Filled 2022-06-28: qty 2

## 2022-06-28 MED ORDER — VASOPRESSIN 20 UNITS/100 ML INFUSION FOR SHOCK
INTRAVENOUS | Status: AC
  Filled 2022-06-28: qty 100

## 2022-06-28 MED ORDER — SODIUM BICARBONATE 8.4 % IV SOLN
INTRAVENOUS | Status: AC
  Filled 2022-06-28: qty 150

## 2022-06-28 MED ORDER — DOCUSATE SODIUM 100 MG PO CAPS: 100.0000 mg | ORAL_CAPSULE | Freq: Two times a day (BID) | ORAL | Status: DC | PRN

## 2022-06-28 MED ORDER — AMIODARONE HCL IN DEXTROSE 360-4.14 MG/200ML-% IV SOLN
60.0000 mg/h | INTRAVENOUS | Status: DC
  Filled 2022-06-28: qty 200

## 2022-06-28 MED ORDER — HEPARIN (PORCINE) 25000 UT/250ML-% IV SOLN
2000.0000 [IU]/h | INTRAVENOUS | Status: DC
  Administered 2022-06-28: 2000 [IU]/h via INTRAVENOUS
  Filled 2022-06-28: qty 250

## 2022-06-28 MED ORDER — FUROSEMIDE 10 MG/ML IJ SOLN
60.0000 mg | Freq: Once | INTRAMUSCULAR | Status: AC
  Administered 2022-06-28: 60 mg via INTRAVENOUS
  Filled 2022-06-28: qty 6

## 2022-06-28 MED ORDER — PANTOPRAZOLE SODIUM 40 MG IV SOLR: 40.0000 mg | INTRAVENOUS | Status: DC

## 2022-06-28 MED ORDER — NOREPINEPHRINE 4 MG/250ML-% IV SOLN
0.0000 ug/min | INTRAVENOUS | Status: DC
  Filled 2022-06-28: qty 250

## 2022-06-28 MED ORDER — FENTANYL CITRATE PF 50 MCG/ML IJ SOSY
PREFILLED_SYRINGE | INTRAMUSCULAR | Status: AC
  Filled 2022-06-28: qty 2

## 2022-06-28 NOTE — Progress Notes (Signed)
ANTICOAGULATION CONSULT NOTE - Initial Consult  Pharmacy Consult for heparin Indication: r/o pulmonary embolus  No Known Allergies  Patient Measurements: Height: 6\' 3"  (190.5 cm) Weight: (!) 169.6 kg (373 lb 14.4 oz) IBW/kg (Calculated) : 84.5 Heparin Dosing Weight: 125kg  Vital Signs: Temp: 98.1 F (36.7 C) (08/10 1145) Temp Source: Oral (08/10 1145) BP: 98/82 (08/10 1231) Pulse Rate: 131 (08/10 1231)  Labs: Recent Labs    07/09/2022 1144  HGB 15.0  HCT 47.6  PLT 98*  CREATININE 2.54*  TROPONINIHS 262*    Estimated Creatinine Clearance: 53.8 mL/min (A) (by C-G formula based on SCr of 2.54 mg/dL (H)).   Medical History: Past Medical History:  Diagnosis Date   Gout    Prediabetes     Medications:  Infusions:   ceFEPime (MAXIPIME) IV 2 g (07/05/2022 1310)   heparin      Assessment: 55 yom presented to the ED with SOB. Clinically concerned for PE so initiating IV heparin. Baseline Hgb is WNL but platelets are acutely low compared to baseline. No bleeding noted and he is not on anticoagulation PTA.  Goal of Therapy:  Heparin level 0.3-0.7 units/ml Monitor platelets by anticoagulation protocol: Yes   Plan:  Heparin bolus 4000 units IV x 1 (conservative dose due to thrombocyopenia) Heparin gtt 2000 units/hr Check a 6 hr heparin level and recheck platelets  Daily heparin level and CBC  Walter [name], 08/28/22 07/16/2022,1:16 PM

## 2022-06-28 NOTE — Progress Notes (Addendum)
Several family members at bedside. Daughters Tiffany and Michelle's numbers were added to his chart as contacts. They state that they will be points of contact for future patient placement. Funeral home unknown at this time.   Daughter numbers also added here: Elmarie Shiley - [phone] Marcelino Duster - [phone]

## 2022-06-28 NOTE — Progress Notes (Signed)
Date and time results received: 07/15/2022 1823  (use smartphrase ".now" to insert current time)  Test: LACTIC ACID Critical Value: 6.3  Name of Provider Notified: MCQUAID,MD (Notified at bedside)  Orders Received? Or Actions Taken?: MD AWARE.

## 2022-06-28 NOTE — Procedures (Signed)
Cardiopulmonary Resuscitation Note  Walter [name]  [id number]  1966-06-24  Date:07/17/2022  Time:7:29 PM   Provider Performing:Brent Mccall Will   Procedure: Cardiopulmonary Resuscitation (92950)  Indication(s) Loss of Pulse  Consent N/A  Anesthesia N/A   Time Out N/A   Sterile Technique Hand hygiene, gloves   Procedure Description Called to patient's room for CODE BLUE. Initial rhythm was PEA/Asystole. Patient received high quality chest compressions for 3.5 minutes (1 session of 1 minute, 1 session of 2.5 minutes) with defibrillation or cardioversion when appropriate. Epinephrine was administered every 3 minutes as directed by time Biomedical engineer. Additional pharmacologic interventions included amiodarone, calcium chloride, sodium bicarbonate, and vasopressin. Additional procedural interventions include central line, arterial line, intubation, and bedside echo.  Return of spontaneous circulation was achieved with aggressive vasopressor dosing.  Family to be notified.   Complications/Tolerance N/A   EBL N/A   Specimen(s) N/A  Estimated time to ROSC: 3.5 minutes  [name], MD [place] [facility] Pager: [phone] Cell: [phone] After 7:00 pm call Elink  [phone]

## 2022-06-28 NOTE — Progress Notes (Signed)
Pharmacy Antibiotic Note  Walter [name] is a 56 y.o. male admitted on 06/30/2022 with pneumonia.  Pharmacy has been consulted for cefepime dosing. Pt is afebrile but WBC is elevated at 29. SCr is also well above baseline at 2.54.   Plan: Cefepime 2gm IV Q12H F/u renal fxn, C&S, clinical status  Height: 6\' 3"  (190.5 cm) Weight: (!) 169.6 kg (373 lb 14.4 oz) IBW/kg (Calculated) : 84.5  Temp (24hrs), Avg:98.1 F (36.7 C), Min:97.9 F (36.6 C), Max:98.2 F (36.8 C)  Recent Labs  Lab 07/04/2022 1144  WBC 29.0*  CREATININE 2.54*    Estimated Creatinine Clearance: 53.8 mL/min (A) (by C-G formula based on SCr of 2.54 mg/dL (H)).    No Known Allergies  Antimicrobials this admission: Cefepime 8/10>>  Dose adjustments this admission: N/A  Microbiology results: Pending  Thank you for allowing pharmacy to be a part of this patient's care.  Altan Kraai, 08/28/22 07/14/2022 12:56 PM

## 2022-06-28 NOTE — H&P (Signed)
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 SOB sx started about 4 wks ago and have significantly worsened in the last several days. Minimally mobile in this setting.   In PCP office, He was noted to be tachycardic, hypoxic with SpO2 87, and had lower extremity edema. Endorsed persistent cough, not productive. No hemoptysis. EKG with RBBB and T wave inversions.  He was referred to ED ([facility]) in this setting.  In ED, had increasing O2 requirement, ultimately requiring NPPV after escalating HFNC need. Tachycardic 120s-130s. Labs with Mild electrolyte disturbances (hypoNa HyperK HypoCl), AKI, Leukocytosis, thrombocytopenia, elevated BNP and trop-I CXR with cardiomegaly and some pulm vascular congestion.   There is clinical concern for PE, however with renal function is unable to have CTA chest. Started empirically on Hep gtt. Also started on cefepime, though CXR does not seem c/w PNA. Given lasix for possible acute heart failure. Concern also for vasculitis affecting pulm.   PCCM accepts for admission in this setting     Pertinent  Medical History  Obesity Gout Leukocytoclastic vasculitis  Ble wounds   Significant Hospital Events: Including procedures, antibiotic start and stop dates in addition to other pertinent events   8/10 to ED from PCP office for SOB, hypoxia, tachycardia. Admit to PCCM with incr O2 need + evidence of end organ dysfunction   Interim History / Subjective:  Arrives on BiPAP   Objective   Blood pressure 115/77, pulse (!) 123, temperature (!) 97 F (36.1 C),  temperature source Axillary, resp. rate (!) 21, height _0  (1.905 m), weight (!) 169.6 kg, SpO2 95 %.    Vent Mode: PCV;BIPAP FiO2 (%):  [40 %-60 %] 60 % Set Rate:  [10 bmp] 10 bmp PEEP:  [6 cmH20] 6 cmH20   Intake/Output Summary (Last 24 hours) at 07/01/2022 1713 Last data filed at 07/01/2022 1700 Gross per 24 hour  Intake 720.18 ml  Output --  Net 720.18 ml   Filed Weights   06/27/2022 1106  Weight: (!) 169.6 kg    Examination: General: Chronically and acutely ill appearing adult M in mild resp distress HENT: NCAT. Redundant neck tissue. BiPAP mask in place Lungs: Diminished. Symmetrical chest expansion.  Cardiovascular: tachycardic. Cap refill sluggish.  Abdomen: Obese soft ndnt  Extremities: Cool and clammy to touch. BLE are compression wrapped  Neuro: AAOx3 following commands  GU: defer   Resolved Hospital Problem list     Assessment & Plan:   Acute respiratory failure with hypoxia -ddx PE, pulm vasculitis, infection, HF exacerbation  -PE-- sedentary, on prednisone, tachy, hypoxic, SOB, elevated trop and BNP -Acute heart failure- cardiomegaly, elevated trop, BNP, cold and clammy   -infection - less favored  -pulmonary vasculitis- has leukocytoclastic vasculitis  P -consider early intubation + bronch  -CT chest when intubated  -sending rheum labs as below  -solumedrol as below -Empiric hep gtt  -ECHO  -CVC -- coox, CVP  -sputum sample if we can, empiric abx ok for now  -send RVP  -renal fxn precludes CTA chest -PCT  -extremity dopplers   Leukocytoclastic  vasculitis -RA pos, elevated CRP, ESR ANCA neg when checked in march  -allopurinol dc'd in march (some association with LCV)  P -will incr steroids to 1g solumedrol qD for now  -send diff  -sending ANCA, ANA, Rheum factor, double stranded DNA, CCP, Anti gbm,  CK, aldolase, anti smooth muscle, anti mitochondria  AKI P -trend renal indices, UOP -minimize nephrotoxic agents as able  -may need foley    Acute liver failure P -trend -send coags   RBBB, chronic Elevated troponin  -T wave inversions on ECG concerning for possible anterolateral ischemia P -trend trops  -repeat ECG  -hep gtt   Leukocytosis  -suspect r/t pred. However consider infection superimposed  P -Bcx, UA, sputum cx  -trend fever curve, WBC -empiric abx  BLE wounds due to vasculitis, edema P -WOC consult   AGMA Lactic acidosis P -supportive care    Hyponatremia, mild Hyperkalemia Hypochloremia  P -received 60 lasix in ED  -trend , augment as needed   Thrombocytopenia  P -trend  -send coags   Best Practice (right click and "Reselect all SmartList Selections" daily)   Diet/type: NPO DVT prophylaxis: systemic heparin GI prophylaxis: PPI Lines: N/A Foley:  N/A Code Status:  full code Last date of multidisciplinary goals of care discussion [8/10]  Labs   CBC: Recent Labs  Lab 07/16/2022 1144  WBC 29.0*  HGB 15.0  HCT 47.6  MCV 96.0  PLT 98*    Basic Metabolic Panel: Recent Labs  Lab 07/08/2022 1144  NA 132*  K 5.8*  CL 96*  CO2 18*  GLUCOSE 294*  BUN 67*  CREATININE 2.54*  CALCIUM 9.6   GFR: Estimated Creatinine Clearance: 53.8 mL/min (A) (by C-G formula based on SCr of 2.54 mg/dL (H)). Recent Labs  Lab 06/22/2022 1144 06/23/2022 1337  WBC 29.0*  --   LATICACIDVEN  --  5.6*    Liver Function Tests: Recent Labs  Lab 07/06/2022 1247  AST 598*  ALT 1,450*  ALKPHOS 58  BILITOT 0.8  PROT 7.3  ALBUMIN 4.1   No results for input(s): "LIPASE", "AMYLASE" in the last 168 hours. No results for input(s): "AMMONIA" in the last 168 hours.  ABG No results found for: "PHART", "PCO2ART", "PO2ART", "HCO3", "TCO2", "ACIDBASEDEF", "O2SAT"   Coagulation Profile: Recent Labs  Lab 07/12/2022 1144  INR 1.5*    Cardiac Enzymes: No results for input(s): "CKTOTAL", "CKMB", "CKMBINDEX", "TROPONINI" in the last 168 hours.  HbA1C: Hgb A1c MFr Bld  Date/Time Value Ref Range  Status  05/08/2019 03:52 PM 6.1 (H) <5.7 % of total Hgb Final    Comment:    For someone without known diabetes, a hemoglobin  A1c value between 5.7% and 6.4% is consistent with prediabetes and should be confirmed with a  follow-up test. . For someone with known diabetes, a value <7% indicates that their diabetes is well controlled. A1c targets should be individualized based on duration of diabetes, age, comorbid conditions, and other considerations. . This assay result is consistent with an increased risk of diabetes. . Currently, no consensus exists regarding use of hemoglobin A1c for diagnosis of diabetes for children. Marland Kitchen   03/26/2014 09:10 AM 6.2 (H) <5.7 % Final    Comment:  According to the ADA Clinical Practice Recommendations for 2011, when HbA1c is used as a screening test:     >=6.5%   Diagnostic of Diabetes Mellitus            (if abnormal result is confirmed)   5.7-6.4%   Increased risk of developing Diabetes Mellitus   References:Diagnosis and Classification of Diabetes Mellitus,Diabetes YNWG,9562,13(YQMVH 1):S62-S69 and Standards of Medical Care in         Diabetes - 2011,Diabetes QION,6295,28 (Suppl 1):S11-S61.      CBG: Recent Labs  Lab 07/01/2022 1638  GLUCAP 251*    Review of Systems:   Review of Systems  Constitutional: Negative.   HENT: Negative.    Eyes: Negative.   Respiratory:  Positive for cough, sputum production and shortness of breath. Negative for hemoptysis and wheezing.   Cardiovascular:  Positive for orthopnea and leg swelling.  Gastrointestinal:        Decreased appetite   Genitourinary:  Positive for dysuria.  Musculoskeletal: Negative.   Skin:  Positive for rash.  Neurological: Negative.   Endo/Heme/Allergies: Negative.   Psychiatric/Behavioral: Negative.      Past Medical History:  He,  has a past medical history of Gout and Prediabetes.   Surgical  History:  History reviewed. No pertinent surgical history.   Social History:   reports that he has never smoked. He has never used smokeless tobacco. He reports that he does not drink alcohol and does not use drugs.   Family History:  His family history is not on file.   Allergies No Known Allergies   Home Medications  Prior to Admission medications   Medication Sig Start Date End Date Taking? Authorizing Provider  ALPRAZolam Duanne Moron) 0.5 MG tablet Take 1 tablet (0.5 mg total) by mouth at bedtime as needed for sleep. 03/13/22   Susy Frizzle, MD  furosemide (LASIX) 40 MG tablet TAKE 1 TABLET BY MOUTH EVERY DAY 05/28/22   Susy Frizzle, MD  gabapentin (NEURONTIN) 100 MG capsule Take 100 mg by mouth at bedtime. 03/13/22   [provider]  HYDROcodone-acetaminophen (NORCO) 5-325 MG tablet Take 1 tablet by mouth every 6 (six) hours as needed for moderate pain. 06/01/22   Susy Frizzle, MD  predniSONE (DELTASONE) 20 MG tablet TAKE 3 TABLETS BY MOUTH DAILY WITH BREAKFAST. Patient not taking: Reported on 07/08/2022 03/29/22   Susy Frizzle, MD     Critical care time: 80 minutes       CRITICAL CARE Performed by: Cristal Generous   Total critical care time: 80 minutes  Critical care time was exclusive of separately billable procedures and treating other patients. Critical care was necessary to treat or prevent imminent or life-threatening deterioration.  Critical care was time spent personally by me on the following activities: development of treatment plan with patient and/or surrogate as well as nursing, discussions with consultants, evaluation of patient's response to treatment, examination of patient, obtaining history from patient or surrogate, ordering and performing treatments and interventions, ordering and review of laboratory studies, ordering and review of radiographic studies, pulse oximetry and re-evaluation of patient's condition.  Eliseo Gum MSN,  AGACNP-BC [place] for pager  07/17/2022, 5:13 PM

## 2022-06-28 NOTE — ED Notes (Signed)
Patient placed on [place] HFNC per O2 sats of 84-86% on 3 L [place]. Patient is not on home O2. O2 sats currently 91-93% on 7 L. MD aware.

## 2022-06-28 NOTE — IPAL (Addendum)
  Interdisciplinary Goals of Care Family Meeting   Date carried out: 19-Jul-2022  Location of the meeting: Bedside  Member's involved: Physician, Nurse Practitioner, Chaplain, and Family Member or next of kin  Durable Power of Attorney or acting medical decision maker: Wife, Patty     Discussion: We discussed goals of care for [employer] .  Discussed rapid decompensation of unclear etiology. Discussed multiple cardiac arrests in the past hours and attempted measures to stabilize and improve Todd's condition.  These measures include MV support, multiple pressors, CV, bicarb pushes, calcium, tnkase -- in addition to ACLS resuscitation during periods of arrest.  Additionally discussed plan for salvage methylene blue.   Discussed that unfortunately, if Tawanna Cooler were to have another cardiac arrest it is unlikely that resuscitation would be of benefit.  Decision reached to continue aggressive care, but in event of arrest, no resuscitation.   Code status: Full DNR  Disposition: Continue current acute care  Time spent for the meeting: 40 minutes     Lanier Clam, NP  07-19-22, 7:21 PM

## 2022-06-28 NOTE — ED Triage Notes (Signed)
Patient here POV from Home.  Endorses SOB ongoing and worsening for approximately 2 Weeks. 99.4 Temperature 2 Days PTA. No Confirmed Fevers.   Sent by PCP for Evaluation. More Recent History of Vasculitis for approximately 2-3 months for which he is being treated for.   NAD Noted during Triage. A&Ox4. GCS 15. BIB Wheelchair.

## 2022-06-28 NOTE — Progress Notes (Signed)
  Echocardiogram 2D Echocardiogram has been performed.  Walter [name] 06/19/2022, 8:14 PM

## 2022-06-28 NOTE — Progress Notes (Signed)
Contacted by MD via secure chat to initiate antibiotic coverage for PJP pneumonia. Orders placed for clindamycin and primaquine. Avoiding Bactrim due to AKI and platelet count.  Rockwell Alexandria, PharmD, [facility] PGY1 Pharmacy Resident 07/09/2022 6:15 PM

## 2022-06-28 NOTE — ED Notes (Signed)
[facility] RT called and given report on patient. 

## 2022-06-28 NOTE — Progress Notes (Signed)
Patient admitted from [facility] via [facility] transport. Alert, ox4, on .60 bipap on arrival. C/o pain in legs which are wrapped in compression wraps. He was on heparin drip on arrival at 2000u/hr. CVC placed by Tessie Fass, NP w/o incident. BP picking up sporadically via cuff. Dr. Kendrick Fries placed right radial aline prior to intubating with minimal sedation d/t low BP. Patient's BP continued to deteriorate despite levo drip. Pulseless VT required defib x 1 which then deteriorated to PEA. Multiple pressors, bicarb, calcium chloride, methylene blue, amiodarone given with only brief periods of ROSC.  TNK given w/o improvement. Wife at bedside and spoke to Dr. Kendrick Fries and Dr. Francine Graven. Patient made DNR.

## 2022-06-28 NOTE — Progress Notes (Signed)
Chaplain engaged in an initial visit with Cordel's wife.  Chaplain provided support to Mrs. Runnels through [employer].  Mrs. Lochner shared Isaul's healthcare journey and what had brought them into the hospital.  She voiced that Will had been dealing with pain in his legs since March.  It had gotten so bad that he had to take medical leave from work a month ago.  Orrie was also going to the wound center twice a week to have his legs wrapped.  Mrs. Marcou shared that Keiland was in so much pain.   Mrs. Ned and Dejaun have been married about 24 years with an anniversary coming up next week. They met through her job working at [employer] union.  She talked about the ways Kalim loves sweets like chocolate, was stubborn, enjoyed being a [place], and valued working.    Chaplain offered reflective listening, support, and a compassionate presence as Mrs. Dulworth processed Damir's death and waited for family to arrive.     07/08/2022 2100  Clinical Encounter Type  Visited With Patient;Patient and family together  Visit Type Code;Death;Spiritual support;Initial  Spiritual Encounters  Spiritual Needs Emotional;Grief support

## 2022-06-28 NOTE — Procedures (Signed)
Central Venous Catheter Insertion Procedure Note  Walter [name]  [id number]  [birth year]  Date:July 06, 2022  Time:5:52 PM   Provider Performing:Mariabelen Pressly E Matin Mattioli   Procedure: Insertion of Non-tunneled Central Venous [phone]) with US guidance ([id number])   Indication(s) Medication administration and Difficult access  Consent Risks of the procedure as well as the alternatives and risks of each were explained to the patient and/or caregiver.  Consent for the procedure was obtained and is signed in the bedside chart  Anesthesia Topical only with 1% lidocaine   Timeout Verified patient identification, verified procedure, site/side was marked, verified correct patient position, special equipment/implants available, medications/allergies/relevant history reviewed, required imaging and test results available.  Sterile Technique Maximal sterile technique including full sterile barrier drape, hand hygiene, sterile gown, sterile gloves, mask, hair covering, sterile ultrasound probe cover (if used).  Procedure Description Area of catheter insertion was cleaned with chlorhexidine and draped in sterile fashion.  With real-time ultrasound guidance a central venous catheter was placed into the left internal jugular vein. Placement of guidewire within Left IJ vein was verified with ultrasound x 2 views prior to dilation of vessel and placement of central venous catheter. Nonpulsatile blood flow and easy flushing noted in all ports.  The catheter was sutured in place with biopatch appropriately placed, and sterile dressing applied.  Complications/Tolerance None; patient tolerated the procedure well. Chest X-ray is ordered to verify placement for internal jugular or subclavian cannulation.   Chest x-ray is not ordered for femoral cannulation.  EBL Minimal  Specimen(s) None    Tessie Fass MSN, AGACNP-BC [facility] Pulmonary/Critical Care Medicine [facility] for pager Jul 06, 2022, 5:53 PM

## 2022-06-28 NOTE — Progress Notes (Signed)
[facility]  Shortly after admission patient's blood pressure started to deteriorate as well as his oxygen requirements.  This was associated with more delirium.  We discussed his situation with his wife and advised immediate central line placement for vasopressors.  We did this, but I am on 10 mics per minute of Levophed but his blood pressure remained low so we increased this dose and placed an arterial line which confirmed severe shock.  With vasopressin and 40 mics a minute of Levophed we were able to intubate him without a cardiac arrest recurrence.  The gated sodium bicarbonate at this time.  Unfortunately despite these interventions his blood pressure continued to deteriorate.  Bedside echo was attempted and windows were very difficult but it appeared that LV function was vigorous and there was no tamponade.  His blood pressure continued to deteriorate.  We gave an amp of epinephrine and his blood pressure continue to deteriorate further.  He developed wide-complex tachycardia so defibrillating doses was given.  Approximately 5 minutes after this he developed PEA arrest.  He underwent CPR and 2 separate occasions 1 for 1 minute, another for 2 and half minutes.  He had ROSC but required maximum dose of epinephrine infusion, vasopressin infusion, and Levophed.  At this time we empirically administered tenecteplase for concern for acute pulmonary embolism. It is likely precipitous.  Of note, prior to intubation the patient's right pupil was noted to be dilated but he was able to talk to [place] and was moving all 4 extremities and was felt to have a nonpalpable neurologic exam. Stat echo requested.  At this time it remains unclear the cause of this progressive decline.  We have treated empirically for pulmonary embolism and at least for the last 30 minutes his blood pressure has not worsened some and continue with this course.  Continue full mechanical ventilatory support.  Continue vasopressors.  Discussed  situation with his wife and if he were to deteriorate further CPR it is very unlikely he would survive.  Based on this she said that she would not want to undergo more CPR but she is comfortable conversing in full aggressive care.  Additional cc time 1 hour  Heber Genesee, MD [place] P[facility] Pager: [phone] Cell: [phone] After 7:00 pm call Elink  [phone]

## 2022-06-28 NOTE — ED Notes (Addendum)
First contact with Patient. RT at bedside. HR elevated in the 120s and satruations at 88%. Tachypnea and increased work of breathing noted. Patient speaking in 3-4 word sentences. Patient states this has been going on for 2 weeks. CXR changed to bedside due to patient condition.   1144: Blood collected and sent to lab. MD made aware. RT at bedside to place patient on high flow o2. Family at bedside. Patient has dressings on his legs - per family- he is being seen by wound care for poor vasculature. Patient is diaphoretic.   1232: Lasix given at this time - Patient fitted for a male urinary bag hooked to suction. (Per request) Patient states he is breathing easier at this time.  1313: Patient is a difficult stick. CRN made aware. States will attempt ultrasound guided IV to obtain blood.  1417: Patient repositioned for comfort at this time. No change in status. Respirations still elevated. RT consistently at bedside for monitoring. Patient states he doesn't have to urinate at this time. No urine output noted. MD made aware.   1512: Patient repositioned. States his pain in his legs is improving. Patient accidentally ripped out his IV in his left hand. Catheter intact. [facility]waiting [facility] for transport at this time. Family made aware. Saturations continuing to drop despite o2 administration. Bipap ordered. Patient unable to produce urine sample at this time. MD made aware.   1545: [facility] at bedside. Report given to Alberteen Spindle for continuation of care. Joyce Gross RN at [facility] called and report given. No further questions voiced upon completion of report.   1554: no acute distress noted upon this RN's departure of Patient.

## 2022-06-28 NOTE — Progress Notes (Signed)
Pupil assessment prior to TNK administration.

## 2022-06-28 NOTE — Procedures (Signed)
Intubation Procedure Note  Walter [name]  [id number]  1966/02/24  Date:07/07/22  Time:7:25 PM   Provider Performing:Brent Akyia Borelli    Procedure: Intubation (31500)  Indication(s) Respiratory Failure  Consent Risks of the procedure as well as the alternatives and risks of each were explained to the patient and/or caregiver.  Consent for the procedure was obtained and is signed in the bedside chart   Anesthesia Etomidate, Versed, Fentanyl, and Rocuronium   Time Out Verified patient identification, verified procedure, site/side was marked, verified correct patient position, special equipment/implants available, medications/allergies/relevant history reviewed, required imaging and test results available.   Sterile Technique Usual hand hygeine, masks, and gloves were used   Procedure Description Patient positioned in bed supine.  Sedation given as noted above.  Patient was intubated with endotracheal tube using Glidescope.  View was Grade 1 full glottis .  Number of attempts was 1.  Colorimetric CO2 detector was consistent with tracheal placement.   Complications/Tolerance None; patient tolerated the procedure well. Chest X-ray is ordered to verify placement.   EBL Minimal   Specimen(s) None  [name], MD [place] [facility] Pager: [phone] Cell: [phone] After 7:00 pm call Elink  [phone]

## 2022-06-28 NOTE — Progress Notes (Signed)
Subjective:    Patient ID: Walter [name], male    DOB: 01-28-1966, 56 y.o.   MRN: [id number]  HPI Patient is a 56 year old Caucasian gentleman here today complaining of shortness of breath.  He is hypoxic at 87% on room air.  He is unable to lay flat due to shortness of breath and coughing.  He is having to sleep sitting up.  He has significant swelling in his legs.  He is currently being treated for vasculitis.  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  There is been no evidence of any organ involvement.  He denies any hemoptysis.  He does report low-grade fevers.  He denies any purulent sputum.  He does report thick mucus.  On exam today he is tachycardic.  EKG shows a right bundle branch block but no evidence of ischemia infarction.  He has diffuse T wave inversions throughout concerning for underlying pulmonary disease.  Lung exam shows no bibasilar crackles to suggest pulmonary edema or pneumonia.  He does have some faint expiratory wheezing. Past Medical History:  Diagnosis Date   Gout    Prediabetes    No past surgical history on file. Current Outpatient Medications on File Prior to Visit  Medication Sig Dispense Refill   ALPRAZolam (XANAX) 0.5 MG tablet Take 1 tablet (0.5 mg total) by mouth at bedtime as needed for sleep. 30 tablet 1   furosemide (LASIX) 40 MG tablet TAKE 1 TABLET BY MOUTH EVERY DAY 90 tablet 1   gabapentin (NEURONTIN) 100 MG capsule Take 100 mg by mouth at bedtime.     HYDROcodone-acetaminophen (NORCO) 5-325 MG tablet Take 1 tablet by mouth every 6 (six) hours as needed for moderate pain. 30 tablet 0   predniSONE (DELTASONE) 20 MG tablet TAKE 3 TABLETS BY MOUTH DAILY WITH BREAKFAST. 90 tablet 0   No current facility-administered medications on file prior to visit.   No Known Allergies Social History   Socioeconomic History   Marital status: Married    Spouse name: Not on file   Number of children: Not on file   Years of education: Not on file    Highest education level: Not on file  Occupational History   Not on file  Tobacco Use   Smoking status: Never   Smokeless tobacco: Never  Substance and Sexual Activity   Alcohol use: No   Drug use: No   Sexual activity: Yes  Other Topics Concern   Not on file  Social History Narrative   Not on file   Social Determinants of Health   Financial Resource Strain: Not on file  Food Insecurity: Not on file  Transportation Needs: Not on file  Physical Activity: Not on file  Stress: Not on file  Social Connections: Not on file  Intimate Partner Violence: Not on file      Review of Systems  Constitutional:  Positive for fatigue.  HENT:  Negative for nosebleeds.   Respiratory:  Negative for cough, shortness of breath and wheezing.   Cardiovascular:  Positive for leg swelling. Negative for chest pain.  Gastrointestinal:  Negative for abdominal pain.  Genitourinary:  Negative for dysuria and hematuria.  Skin:  Positive for rash.       Objective:   Physical Exam Constitutional:      General: He is not in acute distress.    Appearance: Normal appearance. He is obese. He is not ill-appearing or toxic-appearing.  Cardiovascular:     Rate and Rhythm: Regular rhythm. Tachycardia  present.     Pulses: Normal pulses.     Heart sounds: Normal heart sounds.  Pulmonary:     Effort: Tachypnea and accessory muscle usage present.     Breath sounds: Normal breath sounds.  Musculoskeletal:     Right lower leg: Edema present.     Left lower leg: Edema present.  Skin:    Findings: Rash present. Rash is macular and purpuric.  Neurological:     Mental Status: He is alert.           Assessment & Plan:  Tachycardia - Plan: EKG 12-Lead  Hypoxia Given his hypoxia and increased work of breathing, patient needs to go to the emergency room for further evaluation.  My differential diagnosis includes congestive heart failure given his orthopnea and leg swelling versus vasculitis in the lung  versus bronchospasm versus pulmonary embolism given his sedentary lifestyle and his sudden onset of shortness of breath.  He has basically been sitting in a wheelchair constantly since he was diagnosed with vasculitis.  Therefore I feel that he needs to go to the emergency room for evaluation to rule out pulmonary embolism, to get a chest x-ray to evaluate for pneumonia or pulmonary edema and then for treatment.  I am unable to get this information back in a time critical fashion.  Therefore I will refer the patient to the emergency room

## 2022-06-28 NOTE — Progress Notes (Signed)
HFNC ([place]) increased to 8 L per O2 sat of 86-87% on 7 L. Patient currently 89-91%.

## 2022-06-28 NOTE — ED Provider Notes (Signed)
[facility] EMERGENCY DEPT Provider Note   CSN: [id number] Arrival date & time: Jul 21, 2022  1051     History  Chief Complaint  Patient presents with   Shortness of Breath    Walter [name] is a 56 y.o. male presented with shortness of breath ongoing for about 2 weeks.  The patient's wife say his symptoms are progressively worsening.  He has a persistent cough.  He is feeling unwell.  He says his legs have chronic swelling but seem worse than normal.  He denies any known history of congestive heart failure.  He is on Lasix as needed for leg swelling.  Low-dose.  He does not wear oxygen at home.  He does not smoke.  He normally is walking for the past 2 days is essentially been wheelchair-bound, reporting dyspnea on exertion and leg swelling.  He does have a history of vasculitis which affects lower extremities  I reviewed his external records.  He was referred in by his primary care provider with concern for pulmonary embolism rule out congestive heart failure exacerbation given the patient's sedentary lifestyle,  HPI     Home Medications Prior to Admission medications   Medication Sig Start Date End Date Taking? Authorizing Provider  ALPRAZolam Prudy Feeler) 0.5 MG tablet Take 1 tablet (0.5 mg total) by mouth at bedtime as needed for sleep. 03/13/22   Donita Brooks, MD  furosemide (LASIX) 40 MG tablet TAKE 1 TABLET BY MOUTH EVERY DAY 05/28/22   Donita Brooks, MD  gabapentin (NEURONTIN) 100 MG capsule Take 100 mg by mouth at bedtime. 03/13/22   [provider]  HYDROcodone-acetaminophen (NORCO) 5-325 MG tablet Take 1 tablet by mouth every 6 (six) hours as needed for moderate pain. 06/01/22   Donita Brooks, MD  predniSONE (DELTASONE) 20 MG tablet TAKE 3 TABLETS BY MOUTH DAILY WITH BREAKFAST. Patient not taking: Reported on 07-21-22 03/29/22   Donita Brooks, MD      Allergies    Patient has no known allergies.    Review of Systems   Review of  Systems  Physical Exam Updated Vital Signs BP 121/84 (BP Location: Left Arm)   Pulse (!) 115   Temp 98.2 F (36.8 C) (Oral)   Resp (!) 24   Ht 6\' 3"  (1.905 m)   Wt (!) 169.6 kg   SpO2 91%   BMI 46.73 kg/m  Physical Exam Constitutional:      General: He is not in acute distress.    Appearance: He is obese.  HENT:     Head: Normocephalic and atraumatic.  Eyes:     Conjunctiva/sclera: Conjunctivae normal.     Pupils: Pupils are equal, round, and reactive to light.  Cardiovascular:     Rate and Rhythm: Regular rhythm. Tachycardia present.  Pulmonary:     Effort: Pulmonary effort is normal. No respiratory distress.     Comments: Distant breath sounds due to patient's large body habitus, fine crackles notable Abdominal:     General: There is no distension.     Tenderness: There is no abdominal tenderness.  Musculoskeletal:     Comments: Symmetrical edematous lower extremities with compression stockings on, vasculitic skin changes noted on the lower extremities  Skin:    General: Skin is warm and dry.  Neurological:     General: No focal deficit present.     Mental Status: He is alert. Mental status is at baseline.  Psychiatric:        Mood and Affect:  Mood normal.        Behavior: Behavior normal.     ED Results / Procedures / Treatments   Labs (all labs ordered are listed, but only abnormal results are displayed) Labs Reviewed  BASIC METABOLIC PANEL  CBC  BRAIN NATRIURETIC PEPTIDE  TROPONIN I (HIGH SENSITIVITY)    EKG None  Radiology DG Chest Port 1 View  Result Date: 06/26/2022 CLINICAL DATA:  [id number]; increased shortness of breath the dyspnea over the past 2 weeks EXAM: PORTABLE CHEST 1 VIEW COMPARISON:  October 20, 2021 FINDINGS: Moderately large enlargement of the cardiomediastinal silhouette with ectatic thoracic aorta, stable. Mild central pulmonary vascular congestion. No consolidation or pleural effusion. The visualized skeletal structures are  unremarkable. IMPRESSION: Cardiomegaly with mild central pulmonary vascular congestion without significant interval change. Electronically Signed   By: Marjo Bicker M.D.   On: 07/02/2022 11:43    Procedures .Critical Care  Performed by: Terald Sleeper, MD Authorized by: Terald Sleeper, MD   Critical care provider statement:    Critical care time (minutes):  45   Critical care time was exclusive of:  Separately billable procedures and treating other patients   Critical care was necessary to treat or prevent imminent or life-threatening deterioration of the following conditions:  Sepsis and respiratory failure   Critical care was time spent personally by me on the following activities:  Ordering and performing treatments and interventions, ordering and review of laboratory studies, ordering and review of radiographic studies, pulse oximetry, review of old charts, examination of patient and evaluation of patient's response to treatment     Medications Ordered in ED Medications - No data to display  ED Course/ Medical Decision Making/ A&P Clinical Course as of 07-01-2022 [id number]  Thu Jun 28, 2022  1251 Patient still satting 90% on his humidified nasal cannula, discussed his lab results with him and his wife, elevated troponins, AKI with elevated BUN, creatinine, potassium, significantly elevated leukocytosis.  This could be multifactorial at this point, cannot exclude the possibility of pneumonia, or pulmonary embolism, or vasculitis.  I still think he does need diuresis and the Lasix was given, however he may be intravascular depleted, with his prerenal AKI, and have advised a small fluid bolus to be given back, in addition to cefepime for broad-spectrum pneumonia coverage.  His GFR is below 30, with a clear AKI, I would prefer to avoid an iodine contrast for PE study.  We can start him empirically on heparin until he arrives at the hospital where he could have a V/Q study or an echocardiogram to  look for heart strain.  Paging ICU [MT]  1312 Spoke to Dr Kendrick Fries For pulmonary crit, who is accepted the patient for ICU admission.  The patient is currently on 8 L nasal cannula.  Patient is requesting pain medication for his edema in his legs, small dose of fentanyl was ordered.  Blood pressure remains soft but stable, not requiring vasopressors.  He is still maintaining his airway well.  Not requiring BiPAP or intubation at this time.  ICU team by phone did agree with current plan for antibiotics for pneumonia coverage as well as IV heparin empirically for possible PE. [MT]    Clinical Course User Index [MT] Delrico Minehart, Kermit Balo, MD                           Medical Decision Making Amount and/or Complexity of Data Reviewed Labs: ordered. Radiology: ordered.  Risk Prescription drug management. Decision regarding hospitalization.   This patient presents to the ED with concern for shortness of breath, leg swelling. This involves an extensive number of treatment options, and is a complaint that carries with it a high risk of complications and morbidity.  The differential diagnosis includes pneumonia versus congestive heart failure versus pneumothorax versus viral URI versus anemia versus arrhythmia versus other  There is also some clinical concern for PE given the patient's history of vasculitis and a sedentary lifestyle.  Infection/sepsis also remains on differential, as well as vasculitis complications, and new onset CHF.  Co-morbidities that complicate the patient evaluation: History of vasculitis, obesity  Additional history obtained from patient's wife at the bedside  External records from outside source obtained and reviewed including PCP outpatient office record today  I ordered and personally interpreted labs.  The pertinent results include:  troponin elevation, transminitis, BNP elevation, WBC 29.0, aki with K 5.8  I ordered imaging studies including x-ray of the chest, CT of the  chest I independently visualized and interpreted imaging which showed no evident infiltrate I agree with the radiologist interpretation  The patient was maintained on a cardiac monitor.  I personally viewed and interpreted the cardiac monitored which showed an underlying rhythm of: Sinus  Per my interpretation the patient's ECG shows sinus tachycardia with no acute ischemic finding  I ordered medication including IV Lasix for suspected pulmonary edema, cefepime for suspected PNA (cough, leukocytosis), small NS bolus ordered given patient's evident pulm congestion and hypoxia, subsequently patient was started on IV NS infusion given elevated lactate and concern for severe sepsis.  Fentanyl was given for patient's reported bilateral leg pain I have reviewed the patients home medicines and have made adjustments as needed  Test Considered: higher clinical suspicion for PE in this setting, but given acute AKI and GFR< 30, we have opted for empiric treatment with heparin and discussion with ICU team for alternate PE testing on arrival at hospital, such as echo or V/Q study, rather than iodine contrast scan at this time, which was felt to carry a high risk of potential kidney nephropathy  I requested consultation with the ICU,  and discussed lab and imaging findings as well as pertinent plan - they recommend: see edcourse  After the interventions noted above, I reevaluated the patient and found that they have: stayed the same  The patient did have increased O2 requirement, and at the time of signout to EDP Dr Ollen Barges, now s/p admission to ICU (pending transfer), he was started on bipap for respiratory support.   Dispostion:  After consideration of the diagnostic results and the patients response to treatment, I feel that the patent would benefit from medical ICU admission..         Final Clinical Impression(s) / ED Diagnoses Final diagnoses:  None    Rx / DC Orders ED Discharge Orders      None         Milind Raether, Kermit Balo, MD 07/01/2022 [phone]

## 2022-06-28 NOTE — Procedures (Signed)
Arterial Catheter Insertion Procedure Note  Walter [name]  [id number]  Jun 11, 1966  Date:Jul 13, 2022  Time:7:25 PM    Provider Performing: Heber Quincy    Procedure: Insertion of Arterial Line (83254) with US guidance (98264)   Indication(s) Blood pressure monitoring and/or need for frequent ABGs  Consent Risks of the procedure as well as the alternatives and risks of each were explained to the patient and/or caregiver.  Consent for the procedure was obtained and is signed in the bedside chart  Anesthesia None   Time Out Verified patient identification, verified procedure, site/side was marked, verified correct patient position, special equipment/implants available, medications/allergies/relevant history reviewed, required imaging and test results available.   Sterile Technique Maximal sterile technique including full sterile barrier drape, hand hygiene, sterile gown, sterile gloves, mask, hair covering, sterile ultrasound probe cover (if used).   Procedure Description Area of catheter insertion was cleaned with chlorhexidine and draped in sterile fashion. With real-time ultrasound guidance an arterial catheter was placed into the right radial artery.  Appropriate arterial tracings confirmed on monitor.     Complications/Tolerance None; patient tolerated the procedure well.   EBL Minimal   Specimen(s) None  Heber Alpine, MD [place] [facility] Pager: [phone] Cell: [phone] After 7:00 pm call Elink  [phone]

## 2022-06-28 NOTE — Progress Notes (Signed)
PCCM Update:  Patient continued to have severe hypoxemia and progressive shock despite TPA therapy. ABG was obtained which showed pH 6.9, PCO2 93 and pO2 90. We lost pulsatility on the arterial line and upon pulse check confirmed he had lost pulse at 7:50pm. He had absent heart sounds on auscultation and mechanical breath sounds. Pupils were not reactive to light, fixed, and dilated. He was not responsive to verbal or tactile stimuli. He was pronounced dead at 48. Wife was present at the bedside. I called the medical examiner and patient's case was denied due to no foul play involved. We discussed the option of private autopsy with direct billing to the family and this was declined. I informed the wife that the echo tech noted clot present in the right side of the heart which further supported the concern for massive pulmonary emboli leading to cardiac compromise and his death.   Hospital chaplain was present at bedside for support.   Melody Comas, MD [facility] Office: [phone]   See Amion for personal pager PCCM on call pager [phone] until 7pm. Please call Elink 7p-7a. [phone]

## 2022-06-29 ENCOUNTER — Encounter (HOSPITAL_BASED_OUTPATIENT_CLINIC_OR_DEPARTMENT_OTHER): Payer: BC Managed Care – PPO | Admitting: General Surgery

## 2022-06-29 LAB — ECHOCARDIOGRAM LIMITED
Height: 75 in
Weight: 5982.4 oz

## 2022-06-29 LAB — PATHOLOGIST SMEAR REVIEW

## 2022-06-30 LAB — ANA W/REFLEX IF POSITIVE: Anti Nuclear Antibody (ANA): NEGATIVE

## 2022-07-02 LAB — ANCA PROFILE

## 2022-07-02 LAB — MITOCHONDRIAL ANTIBODIES

## 2022-07-02 LAB — ALDOLASE

## 2022-07-02 LAB — ANTI-DNA ANTIBODY, DOUBLE-STRANDED

## 2022-07-02 LAB — GLOMERULAR BASEMENT MEMBRANE ANTIBODIES

## 2022-07-03 ENCOUNTER — Ambulatory Visit (HOSPITAL_BASED_OUTPATIENT_CLINIC_OR_DEPARTMENT_OTHER): Payer: 59 | Admitting: General Surgery

## 2022-07-03 LAB — CULTURE, BLOOD (ROUTINE X 2)
Culture: NO GROWTH
Special Requests: ADEQUATE

## 2022-07-20 NOTE — Death Summary Note (Signed)
DEATH SUMMARY   Patient Details  Name: Walter [name] MRN: [id number] DOB: 1966/03/25  Admission/Discharge Information   Admit Date:  2022-07-09  Date of Death: Date of Death: 07-09-22  Time of Death: Time of Death: Mar 29, 1949  Length of Stay: 1  Referring Physician: Donita Brooks, MD   Reason(s) for Hospitalization  Dyspnea  Diagnoses  Preliminary cause of death:  Acute pulmonary embolism Secondary Diagnoses (including complications and co-morbidities, all present on admisison):  Principal Problem:   Respiratory failure (HCC) Active Problems:   Acute respiratory failure with hypoxia (HCC) Acute pulmonary embolism Morbid obesity 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  Denies chest pain, fevers or chills.  No hemoptysis.  He developed leg wounds in March with a livedo reticularis type rash so a punch biopsy was performed showing leukocytoclastic vasculitis.  He had blood work positive for rheumatoid factor so he was sent to rheumatology who started treating him with prednisone  bid.  He has been getting ongoing wound care which he and his wife say were helping.  The rash did not extend or worseng.  He never followed up for further labs or work up by rheumatology and has been taking the prednisone.  However he stopped taking prednisone last week abruptly when he ran out.     In the ER at [facility] he was started empirically on heparin for possible PE.  He was also given cefepime and fentanyl for pain.     There his hypoxemia has been worsening on  high flow nasal cannula so he was changed to BIPAP.  He remains short of breath and has developed some hypotension after arrival to [facility].  Hospital course: Shortly after admission patient's blood pressure started to deteriorate as well as his oxygen requirements.  This was associated with more delirium.  We discussed his situation with his wife and advised immediate central line placement for vasopressors.  We did this, but I am on 10 mics per minute of Levophed but his blood pressure remained low so we increased this dose and placed an arterial line which confirmed severe shock.  With vasopressin and 40 mics a minute of Levophed we were able to intubate him without a cardiac arrest recurrence.  The gated sodium bicarbonate at this time.  Unfortunately despite these interventions his blood pressure continued to deteriorate.  Bedside echo was attempted and windows were very difficult but it appeared that LV function was vigorous and there was no tamponade.  His blood pressure continued to deteriorate.  We gave an amp of epinephrine and his blood pressure continue to deteriorate further.  He developed wide-complex tachycardia so defibrillating doses was given.  Approximately 5 minutes after this he developed PEA arrest.  He underwent CPR and 2 separate occasions 1 for 1 minute, another for 2 and half minutes.  He had ROSC but required maximum dose of epinephrine infusion, vasopressin infusion, and Levophed.  At this time we empirically administered tenecteplase for concern for acute pulmonary embolism. It is likely precipitous.  Of note, prior to intubation the patient's right pupil was noted to be dilated but he  was able to talk to [place] and was moving all 4 extremities and was felt to have a non focal neurologic exam.  We gave tenecteplase (systemic thrombolysis) after he had a brief cardiac arrest.  After that he did not have further loss of pulses long enough to have an echocardiogram performed.  However at the  conclusion of the echo which showed RV failure and a clot in transit confirming the diagnosis of pulmonary embolism, the patient loss a pulse on maximum doses of vasopressors.   We had previously discussed situation with his wife and if he were to deteriorate further CPR it is very unlikely he would survive.  Based on this she said that she would not want to undergo more CPR but she is comfortable conversing in full aggressive care.  When he lost pulses again we did not perform CPR per her request.  The patient's time of death was 1950.   Pertinent Labs and Studies  Significant Diagnostic Studies ECHOCARDIOGRAM LIMITED  Result Date: 07-12-2022    ECHOCARDIOGRAM REPORT   Patient Name:   KAILYN DUBIE Date of Exam: 07/13/2022 Medical Rec #:  [id number]       Height:       75.0 in Accession #:    [id number]      Weight:       373.9 lb Date of Birth:  07/06/1966       BSA:          2.863 m Patient Age:    55 years        BP:           115/77 mmHg Patient Gender: M               HR:           70 bpm. Exam Location:  Inpatient Procedure: Limited Echo STAT ECHO Indications:    pulmonary embolus  History:        Patient has no prior history of Echocardiogram examinations.  Sonographer:    Delcie Roch RDCS Referring Phys: [phone] GRACE E BOWSER  Sonographer Comments: Technically difficult study due to poor echo windows, echo performed with patient supine and on artificial respirator and patient is obese. Image acquisition challenging due to patient body habitus. IMPRESSIONS  1. Linear echodensity in right atrium likely represents thrombus in transit.  2. Right ventricular systolic function is severely reduced. The right ventricular size is severely enlarged.  3. Right atrial size was moderately dilated. FINDINGS  Right Ventricle: The right ventricular size is severely enlarged. Right ventricular systolic function is severely reduced. Right Atrium: Right atrial size was moderately dilated. Weston Brass MD  Electronically signed by Weston Brass MD Signature Date/Time: 2022-07-12/9:23:20 AM    Final    DG Chest Port 1 View  Result Date: 07/04/2022 CLINICAL DATA:  [id number].  Intubation. EXAM: PORTABLE CHEST 1 VIEW COMPARISON:  Chest x-ray 10/20/2021, chest x-ray 06/27/2022 11:43 a.m. FINDINGS: Cardiac paddles overlie the patient. Endotracheal tube with tip terminating 6 cm above the carina. Left internal jugular central venous catheter with tip overlying the expected region of distal superior vena cava. Query worsening mediastinal widening measuring 15 cm with some component possibly due to AP portable technique. Enlarged cardiac silhouette with underlying pericardial effusion not excluded. No focal consolidation. No pulmonary edema. No pleural effusion. No pneumothorax. No acute osseous abnormality. IMPRESSION: 1. Query worsening mediastinal widening measuring 15 cm with some component possibly due to AP portable technique. If patient is stable, consider  repeat PA and lateral view of the chest. If unstable, please consider CT angiography chest for further evaluation. 2. Enlarged cardiac silhouette with underlying pericardial effusion not excluded. 3. Lines and tubes in appropriate position. These results Walter be called to the ordering clinician or representative by the Radiologist Assistant, and communication documented in the PACS or [employer]. Electronically Signed   By: Tish Frederickson M.D.   On: 2022-07-10 19:01   DG Chest Port 1 View  Result Date: 07-10-22 CLINICAL DATA:  [id number]; increased shortness of breath the dyspnea over the past 2 weeks EXAM: PORTABLE CHEST 1 VIEW COMPARISON:  October 20, 2021 FINDINGS: Moderately large enlargement of the cardiomediastinal silhouette with ectatic thoracic aorta, stable. Mild central pulmonary vascular congestion. No consolidation or pleural effusion. The visualized skeletal structures are unremarkable. IMPRESSION: Cardiomegaly with mild central pulmonary  vascular congestion without significant interval change. Electronically Signed   By: Marjo Bicker M.D.   On: 10-Jul-2022 11:43    Microbiology Recent Results (from the past 240 hour(s))  SARS Coronavirus 2 by RT PCR (hospital order, performed in [facility] hospital lab) *cepheid single result test* Anterior Nasal Swab     Status: None   Collection Time: 07-10-2022 12:03 PM   Specimen: Anterior Nasal Swab  Result Value Ref Range Status   SARS Coronavirus 2 by RT PCR NEGATIVE NEGATIVE Final    Comment: (NOTE) SARS-CoV-2 target nucleic acids are NOT DETECTED.  The SARS-CoV-2 RNA is generally detectable in upper and lower respiratory specimens during the acute phase of infection. The lowest concentration of SARS-CoV-2 viral copies this assay can detect is 250 copies / mL. A negative result does not preclude SARS-CoV-2 infection and should not be used as the sole basis for treatment or other patient management decisions.  A negative result may occur with improper specimen collection / handling, submission of specimen other than nasopharyngeal swab, presence of viral mutation(s) within the areas targeted by this assay, and inadequate number of viral copies (<250 copies / mL). A negative result must be combined with clinical observations, patient history, and epidemiological information.  Fact Sheet for Patients:   RoadLapTop.co.za  Fact Sheet for Healthcare Providers: http://kim-miller.com/  This test is not yet approved or  cleared by the [place] FDA and has been authorized for detection and/or diagnosis of SARS-CoV-2 by FDA under an Emergency Use Authorization (EUA).  This EUA Walter remain in effect (meaning this test can be used) for the duration of the COVID-19 declaration under Section 564(b)(1) of the Act, 21 U.S.C. section 360bbb-3(b)(1), unless the authorization is terminated or revoked sooner.  Performed at [facility], [address], [place], [place] [address]   Respiratory (~20 pathogens) panel by PCR     Status: Abnormal   Collection Time: 07/10/22  2:55 PM   Specimen: Nasopharyngeal Swab; Respiratory  Result Value Ref Range Status   Adenovirus NOT DETECTED NOT DETECTED Final   Coronavirus 229E NOT DETECTED NOT DETECTED Final    Comment: (NOTE) The Coronavirus on the Respiratory Panel, DOES NOT test for the novel  Coronavirus (2019 nCoV)    Coronavirus HKU1 NOT DETECTED NOT DETECTED Final   Coronavirus NL63 NOT DETECTED NOT DETECTED Final   Coronavirus OC43 NOT DETECTED NOT DETECTED Final   Metapneumovirus NOT DETECTED NOT DETECTED Final   Rhinovirus / Enterovirus DETECTED (A) NOT DETECTED Final   Influenza A NOT DETECTED NOT DETECTED Final   Influenza B NOT DETECTED NOT DETECTED Final   Parainfluenza Virus 1  NOT DETECTED NOT DETECTED Final   Parainfluenza Virus 2 NOT DETECTED NOT DETECTED Final   Parainfluenza Virus 3 NOT DETECTED NOT DETECTED Final   Parainfluenza Virus 4 NOT DETECTED NOT DETECTED Final   Respiratory Syncytial Virus NOT DETECTED NOT DETECTED Final   Bordetella pertussis NOT DETECTED NOT DETECTED Final   Bordetella Parapertussis NOT DETECTED NOT DETECTED Final   Chlamydophila pneumoniae NOT DETECTED NOT DETECTED Final   Mycoplasma pneumoniae NOT DETECTED NOT DETECTED Final    Comment: Performed at [facility], [address], [place], [place] [address]    Lab Basic Metabolic Panel: Recent Labs  Lab 06/30/2022 1112 06/27/2022 1144 06/20/2022 1844 06/25/2022 1945  NA 141 132* 136 136  K 5.8* 5.8* 5.7* 6.0*  CL 101 96*  --   --   CO2 21* 18*  --   --   GLUCOSE 319* 294*  --   --   BUN 67* 67*  --   --   CREATININE 3.33* 2.54*  --   --   CALCIUM 9.1 9.6  --   --    Liver Function Tests: Recent Labs  Lab 06/25/2022 1247  AST 598*  ALT 1,450*  ALKPHOS 58  BILITOT 0.8  PROT 7.3  ALBUMIN 4.1   No results for input(s): "LIPASE", "AMYLASE" in the  last 168 hours. No results for input(s): "AMMONIA" in the last 168 hours. CBC: Recent Labs  Lab 07/07/2022 1112 06/26/2022 1144 07/16/2022 1844 06/24/2022 1945  WBC 27.0* 29.0*  --   --   NEUTROABS 19.9*  --   --   --   HGB 13.8 15.0 15.6 15.3  HCT 45.7 47.6 46.0 45.0  MCV 100.2* 96.0  --   --   PLT 83* 98*  --   --    Cardiac Enzymes: Recent Labs  Lab 07/14/2022 1112  CKTOTAL 31*   Sepsis Labs: Recent Labs  Lab 06/20/2022 1112 07/17/2022 1144 07/04/2022 1337 07/04/2022 1657  PROCALCITON 1.23  --   --   --   WBC 27.0* 29.0*  --   --   LATICACIDVEN  --   --  5.6* 6.3*    Procedures/Operations  ETT CVL  Arterial line CPR   [employer] 07-12-22, 8:05 PM

## 2022-07-20 DEATH — deceased

## 2022-07-22 MED FILL — Medication: Qty: 1 | Status: AC
